# Patient Record
Sex: Male | Born: 1945 | Race: White | Hispanic: No | Marital: Married | Smoking: Former smoker
Health system: Southern US, Community
[De-identification: ages and names within clinical notes are randomized; demographics above are authoritative.]

## PROBLEM LIST (undated history)

## (undated) DIAGNOSIS — I2510 Atherosclerotic heart disease of native coronary artery without angina pectoris: Secondary | ICD-10-CM

## (undated) DIAGNOSIS — E119 Type 2 diabetes mellitus without complications: Secondary | ICD-10-CM

## (undated) DIAGNOSIS — I10 Essential (primary) hypertension: Secondary | ICD-10-CM

## (undated) DIAGNOSIS — E785 Hyperlipidemia, unspecified: Secondary | ICD-10-CM

## (undated) HISTORY — DX: Essential (primary) hypertension: I10

## (undated) HISTORY — DX: Atherosclerotic heart disease of native coronary artery without angina pectoris: I25.10

## (undated) HISTORY — DX: Hyperlipidemia, unspecified: E78.5

## (undated) HISTORY — DX: Type 2 diabetes mellitus without complications: E11.9

## (undated) HISTORY — PX: CORONARY ARTERY BYPASS GRAFT: SHX141

---

## 2000-09-02 ENCOUNTER — Inpatient Hospital Stay (HOSPITAL_COMMUNITY): Admission: EM | Admit: 2000-09-02 | Discharge: 2000-09-13 | Payer: Self-pay | Admitting: Internal Medicine

## 2000-09-03 ENCOUNTER — Encounter: Payer: Self-pay | Admitting: Internal Medicine

## 2000-09-07 ENCOUNTER — Encounter: Payer: Self-pay | Admitting: Cardiothoracic Surgery

## 2000-09-08 ENCOUNTER — Encounter: Payer: Self-pay | Admitting: Cardiothoracic Surgery

## 2000-09-09 ENCOUNTER — Encounter: Payer: Self-pay | Admitting: Cardiothoracic Surgery

## 2000-09-10 ENCOUNTER — Encounter: Payer: Self-pay | Admitting: Cardiothoracic Surgery

## 2013-10-05 ENCOUNTER — Telehealth: Payer: Self-pay | Admitting: Cardiovascular Disease

## 2013-10-05 ENCOUNTER — Encounter: Payer: Self-pay | Admitting: *Deleted

## 2013-10-05 NOTE — Telephone Encounter (Signed)
Left message asking for patient to call our office.  Need to give him appt information

## 2013-10-09 ENCOUNTER — Ambulatory Visit (INDEPENDENT_AMBULATORY_CARE_PROVIDER_SITE_OTHER): Payer: BC Managed Care – PPO | Admitting: Cardiovascular Disease

## 2013-10-09 ENCOUNTER — Encounter: Payer: Self-pay | Admitting: Cardiovascular Disease

## 2013-10-09 ENCOUNTER — Encounter: Payer: Self-pay | Admitting: *Deleted

## 2013-10-09 VITALS — BP 157/91 | HR 51 | Ht 75.0 in | Wt 261.0 lb

## 2013-10-09 DIAGNOSIS — R200 Anesthesia of skin: Secondary | ICD-10-CM

## 2013-10-09 DIAGNOSIS — I25810 Atherosclerosis of coronary artery bypass graft(s) without angina pectoris: Secondary | ICD-10-CM | POA: Insufficient documentation

## 2013-10-09 DIAGNOSIS — R209 Unspecified disturbances of skin sensation: Secondary | ICD-10-CM

## 2013-10-09 DIAGNOSIS — I10 Essential (primary) hypertension: Secondary | ICD-10-CM | POA: Insufficient documentation

## 2013-10-09 DIAGNOSIS — E785 Hyperlipidemia, unspecified: Secondary | ICD-10-CM | POA: Insufficient documentation

## 2013-10-09 DIAGNOSIS — R079 Chest pain, unspecified: Secondary | ICD-10-CM

## 2013-10-09 DIAGNOSIS — E119 Type 2 diabetes mellitus without complications: Secondary | ICD-10-CM | POA: Insufficient documentation

## 2013-10-09 MED ORDER — NITROGLYCERIN 0.4 MG SL SUBL: 0.4000 mg | SUBLINGUAL_TABLET | SUBLINGUAL | Status: AC | PRN

## 2013-10-09 NOTE — Patient Instructions (Signed)
Your physician has requested that you have an echocardiogram. Echocardiography is a painless test that uses sound waves to create images of your heart. It provides your doctor with information about the size and shape of your heart and how well your heart's chambers and valves are working. This procedure takes approximately one hour. There are no restrictions for this procedure. Your physician has requested that you have a lexiscan myoview. For further information please visit [email or web address]. Please follow instruction sheet, as given. Office will contact with results via phone or letter.   Nitroglycerin as needed for severe chest pain only - new sent to pharm Continue all other medications.   Follow up in 4-6 weeks

## 2013-10-09 NOTE — Progress Notes (Signed)
Patient ID: Colin Aguirre, male   DOB: [birth year], 68 y.o.   MRN: [id number]       CARDIOLOGY CONSULT NOTE  Patient ID: Colin Aguirre MRN: [id number] DOB/AGE: Aug 12, 1945 68 y.o.  Admit date: (Not on file) Primary Physician [name], MD  Reason for Consultation: chest pain, CAD/CABG  HPI: The patient is a 68 year old male who has been referred by Dr. Quintin Alto for the evaluation and treatment of chest pain. 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 As per an office visit note with his PCP on April 16, his HbA1c was most recently found to be 10%. An ECG performed on April 16 showed sinus rhythm, one PVC, and old inferior infarct.  He tells me he had bypass surgery in 2002. He has not had to use any nitroglycerin since that time. For the past 3 or 4 months, he has been experiencing intermittent bouts of chest pain usually lasting seconds, and located in the left precordium. Approximately one week ago, he had chest discomfort which lasted 30 seconds. At that time he had bilateral arm numbness. At the time of his heart attack in 2002, he did not experience chest pain. He said that he suddenly felt that his arms felt like "dead weight"and then felt anxious.   No Known Allergies  Current Outpatient Prescriptions  Medication Sig Dispense Refill  . aspirin EC 81 MG tablet Take 81 mg by mouth daily.      Marland Kitchen ezetimibe (ZETIA) 10 MG tablet Take 10 mg by mouth daily.      . fluconazole (DIFLUCAN) 100 MG tablet Take 100 mg by mouth daily. For 7 days then stop      . hydrochlorothiazide (HYDRODIURIL) 25 MG tablet Take 25 mg by mouth daily.      . Liraglutide (VICTOZA) 18 MG/3ML SOPN Inject 1.2 mg into the skin daily.       . metFORMIN (GLUCOPHAGE-XR) 500 MG 24 hr tablet Take 500 mg by mouth 2 (two) times daily. Take 3 tablets in AM and 2 tablets in PM      . metoprolol (LOPRESSOR) 50 MG  tablet Take 50 mg by mouth 2 (two) times daily.      . Omega-3 Fatty Acids (FISH OIL) 1000 MG CAPS Take 2 capsules by mouth daily.      . ramipril (ALTACE) 10 MG capsule Take 10 mg by mouth daily.      . Dapagliflozin Propanediol (FARXIGA) 5 MG TABS Take 1 tablet by mouth daily.      Marland Kitchen ketoconazole (NIZORAL) 2 % cream Apply 1 application topically as needed.       No current facility-administered medications for this visit.    Past Medical History  Diagnosis Date  . Diabetes mellitus   . Hyperlipidemia   . Hypertension   . CAD (coronary artery disease)     H/O MI AND CABG    Past Surgical History  Procedure Laterality Date  . Coronary artery bypass graft      History   Social History  . Marital Status: Married    Spouse Name: N/A    Number of Children: N/A  . Years of Education: N/A   Occupational History  . Not on file.   Social History Main Topics  . Smoking status: Former Smoker -- 1.00 packs/day for 44 years    Types: Cigarettes    Quit date: 06/23/2003  . Smokeless tobacco: Not on  file  . Alcohol Use: Not on file  . Drug Use: Not on file  . Sexual Activity: Not on file   Other Topics Concern  . Not on file   Social History Narrative  . No narrative on file     No family history of premature CAD in 1st degree relatives.  Prior to Admission medications   Medication Sig Start Date End Date Taking? Authorizing Provider  aspirin EC 81 MG tablet Take 81 mg by mouth daily.   Yes Historical Provider, MD  ezetimibe (ZETIA) 10 MG tablet Take 10 mg by mouth daily.   Yes Historical Provider, MD  fluconazole (DIFLUCAN) 100 MG tablet Take 100 mg by mouth daily. For 7 days then stop 10/05/13  Yes Historical Provider, MD  hydrochlorothiazide (HYDRODIURIL) 25 MG tablet Take 25 mg by mouth daily.   Yes Historical Provider, MD  Liraglutide (VICTOZA) 18 MG/3ML SOPN Inject 1.2 mg into the skin daily.    Yes Historical Provider, MD  metFORMIN (GLUCOPHAGE-XR) 500 MG 24 hr  tablet Take 500 mg by mouth 2 (two) times daily. Take 3 tablets in AM and 2 tablets in PM   Yes Historical Provider, MD  metoprolol (LOPRESSOR) 50 MG tablet Take 50 mg by mouth 2 (two) times daily.   Yes Historical Provider, MD  Omega-3 Fatty Acids (FISH OIL) 1000 MG CAPS Take 2 capsules by mouth daily.   Yes Historical Provider, MD  ramipril (ALTACE) 10 MG capsule Take 10 mg by mouth daily.   Yes Historical Provider, MD  Dapagliflozin Propanediol (FARXIGA) 5 MG TABS Take 1 tablet by mouth daily.    Historical Provider, MD  ketoconazole (NIZORAL) 2 % cream Apply 1 application topically as needed. 10/05/13   Historical Provider, MD     Review of systems complete and found to be negative unless listed above in HPI  BP 157/91  Pulse 51     Physical exam Height 6\' 3"  (1.905 m), weight 261 lb (118.389 kg). General: NAD Neck: No JVD, no thyromegaly or thyroid nodule.  Lungs: Clear to auscultation bilaterally with normal respiratory effort. CV: Nondisplaced PMI.  Heart regular S1/S2, no S3/S4, no murmur.  No peripheral edema.  No carotid bruit.  Normal pedal pulses.  Abdomen: Soft, nontender, no hepatosplenomegaly, no distention.  Skin: Intact without lesions or rashes.  Neurologic: Alert and oriented x 3.  Psych: Normal affect. Extremities: No clubbing or cyanosis.  HEENT: Normal.   Labs:   No results found for this basename: WBC, HGB, HCT, MCV, PLT   No results found for this basename: NA, K, CL, CO2, BUN, CREATININE, CALCIUM, LABALBU, PROT, BILITOT, ALKPHOS, ALT, AST, GLUCOSE,  in the last 168 hours No results found for this basename: CKTOTAL, CKMB, CKMBINDEX, TROPONINI    No results found for this basename: CHOL   No results found for this basename: HDL   No results found for this basename: LDLCALC   No results found for this basename: TRIG   No results found for this basename: CHOLHDL   No results found for this basename: LDLDIRECT         Studies: No results  found.  ASSESSMENT AND PLAN:  1. Chest pain with bilateral arm numbness in the setting of CAD/CABG: Given his most recent symptoms of bilateral arm numbness which appear to be his anginal equivalent, and the fact that he has not had an ischemic evaluation since prior to his bypass surgery, I will proceed with a Lexiscan Cardiolite stress test to  evaluate for ischemia. I will also obtain an echocardiogram to get a baseline assessment of his left ventricular function and to assess regional wall motion. I will give him a prescription for sublingual nitroglycerin to be used as needed. Continue ASA, Zetia, and metoprolol. 2. HTN: It is elevated today, and he tells me that his systolic reading at his PCPs office last week was at least 140 mmHg. I will monitor this and if it remains elevated at his next visit, I would consider the addition of amlodipine. He is already taking good doses of both hydrochlorothiazide and ramipril. 3. Hyperlipidemia: He tells me that his cholesterol was checked at his PCPs office I will try and obtain these results.  Dispo: f/u 4-6 weeks.  Signed: Kate Sable, M.D., F.A.C.C.  10/09/2013, 8:40 AM

## 2013-10-12 ENCOUNTER — Encounter (HOSPITAL_COMMUNITY)
Admission: RE | Admit: 2013-10-12 | Discharge: 2013-10-12 | Disposition: A | Discharge status: Home / Self Care | Payer: BC Managed Care – PPO | Source: Ambulatory Visit | Attending: Cardiovascular Disease | Admitting: Cardiovascular Disease

## 2013-10-12 ENCOUNTER — Encounter (HOSPITAL_COMMUNITY): Payer: Self-pay

## 2013-10-12 ENCOUNTER — Ambulatory Visit (HOSPITAL_COMMUNITY)
Admission: RE | Admit: 2013-10-12 | Discharge: 2013-10-12 | Disposition: A | Discharge status: Home / Self Care | Payer: BC Managed Care – PPO | Source: Ambulatory Visit | Attending: Cardiovascular Disease | Admitting: Cardiovascular Disease

## 2013-10-12 DIAGNOSIS — I25810 Atherosclerosis of coronary artery bypass graft(s) without angina pectoris: Secondary | ICD-10-CM

## 2013-10-12 DIAGNOSIS — R079 Chest pain, unspecified: Secondary | ICD-10-CM | POA: Insufficient documentation

## 2013-10-12 DIAGNOSIS — I2119 ST elevation (STEMI) myocardial infarction involving other coronary artery of inferior wall: Secondary | ICD-10-CM | POA: Insufficient documentation

## 2013-10-12 DIAGNOSIS — I2510 Atherosclerotic heart disease of native coronary artery without angina pectoris: Secondary | ICD-10-CM | POA: Insufficient documentation

## 2013-10-12 DIAGNOSIS — Z951 Presence of aortocoronary bypass graft: Secondary | ICD-10-CM | POA: Insufficient documentation

## 2013-10-12 MED ORDER — REGADENOSON 0.4 MG/5ML IV SOLN
INTRAVENOUS | Status: AC
  Administered 2013-10-12: 0.4 mg via INTRAVENOUS
  Filled 2013-10-12: qty 5

## 2013-10-12 MED ORDER — TECHNETIUM TC 99M SESTAMIBI - CARDIOLITE
30.0000 | Freq: Once | INTRAVENOUS | Status: AC | PRN
  Administered 2013-10-12: 30 via INTRAVENOUS

## 2013-10-12 MED ORDER — TECHNETIUM TC 99M SESTAMIBI GENERIC - CARDIOLITE
10.0000 | Freq: Once | INTRAVENOUS | Status: AC | PRN
  Administered 2013-10-12: 10 via INTRAVENOUS

## 2013-10-12 MED ORDER — SODIUM CHLORIDE 0.9 % IJ SOLN
INTRAMUSCULAR | Status: AC
  Administered 2013-10-12: 10 mL via INTRAVENOUS
  Filled 2013-10-12: qty 10

## 2013-10-12 NOTE — Progress Notes (Signed)
Patient arrived for his nuclear med study but did not stay for the echocardiogram. Staff said that he was advised that he had another test after nuclear study.   Jamison Neighbor, RDCS

## 2013-10-12 NOTE — Progress Notes (Signed)
Stress Lab Nurses Notes - Colin Aguirre  Colin Aguirre 10/12/2013 Reason for doing test: CAD and Chest Pain Type of test: Wille Glaser Nurse performing test: Colin Halls, RN Nuclear Medicine Tech: Colin Aguirre Echo Tech: Not Applicable MD performing test: Branch/K.Lawrence NP Family MD: Colin Aguirre Test explained and consent signed: yes IV started: 22g jelco, Saline lock flushed, No redness or edema and Saline lock started in radiology Symptoms: Headach Treatment/Intervention: None Reason test stopped: protocol completed After recovery IV was: Discontinued via X-ray tech and No redness or edema Patient to return to [place]. Med at : 10:45 Patient discharged: Home Patient's Condition upon discharge was: stable Comments: During test BP 126/80 & HR 99.  Recovery BP 132/85 & HR 83.  Symptoms resolved in recovery. Colin Aguirre

## 2013-10-17 ENCOUNTER — Telehealth: Payer: Self-pay | Admitting: *Deleted

## 2013-10-17 NOTE — Telephone Encounter (Signed)
Message copied by Laurine Blazer on Tue Oct 17, 2013  4:04 PM ------      Message from: Kate Sable A      Created: Fri Oct 13, 2013  1:29 PM       Please have pt f/u with me next week so that I can discuss results and plan next steps of management. ------

## 2013-10-17 NOTE — Telephone Encounter (Signed)
Notes Recorded by Laurine Blazer, LPN on 6/96/2952 at 8:41 PM Patient notified and verbalized understanding. OV scheduled for 10/18/2013 at 1:00 with Dr. Bronson Ing in the [place] office. SK not back in [place] till 10/25/13. ------  Notes Recorded by Laurine Blazer, LPN on 09/12/4008 at 27:25 PM Left message to return call.

## 2013-10-18 ENCOUNTER — Ambulatory Visit (INDEPENDENT_AMBULATORY_CARE_PROVIDER_SITE_OTHER): Payer: BC Managed Care – PPO | Admitting: Cardiovascular Disease

## 2013-10-18 ENCOUNTER — Other Ambulatory Visit: Payer: Self-pay | Admitting: Cardiovascular Disease

## 2013-10-18 ENCOUNTER — Encounter: Payer: Self-pay | Admitting: Cardiovascular Disease

## 2013-10-18 VITALS — BP 142/78 | HR 76 | Ht 72.0 in | Wt 256.0 lb

## 2013-10-18 DIAGNOSIS — E119 Type 2 diabetes mellitus without complications: Secondary | ICD-10-CM

## 2013-10-18 DIAGNOSIS — R079 Chest pain, unspecified: Secondary | ICD-10-CM

## 2013-10-18 DIAGNOSIS — R931 Abnormal findings on diagnostic imaging of heart and coronary circulation: Secondary | ICD-10-CM

## 2013-10-18 DIAGNOSIS — R0602 Shortness of breath: Secondary | ICD-10-CM

## 2013-10-18 DIAGNOSIS — R9439 Abnormal result of other cardiovascular function study: Secondary | ICD-10-CM

## 2013-10-18 DIAGNOSIS — I10 Essential (primary) hypertension: Secondary | ICD-10-CM

## 2013-10-18 DIAGNOSIS — E785 Hyperlipidemia, unspecified: Secondary | ICD-10-CM

## 2013-10-18 DIAGNOSIS — I25810 Atherosclerosis of coronary artery bypass graft(s) without angina pectoris: Secondary | ICD-10-CM

## 2013-10-18 MED ORDER — AMLODIPINE BESYLATE 5 MG PO TABS: 5.0000 mg | ORAL_TABLET | Freq: Every day | ORAL | Status: DC

## 2013-10-18 NOTE — Patient Instructions (Addendum)
Your physician recommends that you schedule a follow-up appointment in 2 months  Heart cath is tomorrow see instruction sheet provided you  Your physician has recommended you make the following change in your medication:     START Norvasc 5 mg daily      Thank you for choosing [facility] !

## 2013-10-18 NOTE — Progress Notes (Signed)
Patient ID: Colin Aguirre, male   DOB: 1945/08/07, 68 y.o.   MRN: [id number]      SUBJECTIVE: The patient is here to f/u on the results of cardiovascular testing performed for the evaluation of chest pain. 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 As per an office visit note with his PCP on April 16, his HbA1c was most recently found to be 10%.  An ECG performed on April 16 showed sinus rhythm, one PVC, and old inferior infarct.  He had told me at his last visit with me that he had bypass surgery in 2002. He has not had to use any nitroglycerin since that time. For the past 3 or 4 months, he has been experiencing intermittent bouts of chest pain usually lasting seconds, and located in the left precordium. Approximately two weeks ago, he had chest discomfort which lasted 30 seconds. At that time he had bilateral arm numbness. At the time of his heart attack in 2002, he did not experience chest pain. He said that he suddenly felt that his arms felt like "dead weight"and then felt anxious.  The day after his stress test, he experienced dyspnea with exertion and mild chest discomfort, but did not require an SL nitro.     No Known Allergies  Current Outpatient Prescriptions  Medication Sig Dispense Refill  . aspirin EC 81 MG tablet Take 81 mg by mouth daily.      . Dapagliflozin Propanediol (FARXIGA) 5 MG TABS Take 1 tablet by mouth daily.      Marland Kitchen ezetimibe (ZETIA) 10 MG tablet Take 10 mg by mouth daily.      . fluconazole (DIFLUCAN) 100 MG tablet Take 100 mg by mouth daily. For 7 days then stop      . hydrochlorothiazide (HYDRODIURIL) 25 MG tablet Take 25 mg by mouth daily.      Marland Kitchen ketoconazole (NIZORAL) 2 % cream Apply 1 application topically as needed.      . Liraglutide (VICTOZA) 18 MG/3ML SOPN Inject 1.2 mg into the skin daily.       . metFORMIN (GLUCOPHAGE-XR) 500 MG 24 hr  tablet Take 500 mg by mouth 2 (two) times daily. Take 3 tablets in AM and 2 tablets in PM      . metoprolol (LOPRESSOR) 50 MG tablet Take 50 mg by mouth 2 (two) times daily.      . nitroGLYCERIN (NITROSTAT) 0.4 MG SL tablet Place 1 tablet (0.4 mg total) under the tongue every 5 (five) minutes as needed for chest pain.  25 tablet  3  . Omega-3 Fatty Acids (FISH OIL) 1000 MG CAPS Take 2 capsules by mouth daily.      . ramipril (ALTACE) 10 MG capsule Take 10 mg by mouth daily.       No current facility-administered medications for this visit.    Past Medical History  Diagnosis Date  . Diabetes mellitus   . Hyperlipidemia   . Hypertension   . CAD (coronary artery disease)     H/O MI AND CABG    Past Surgical History  Procedure Laterality Date  . Coronary artery bypass graft      History   Social History  . Marital Status: Married    Spouse Name: N/A    Number of Children: N/A  . Years of Education: N/A   Occupational History  . Not on file.   Social History Main  Topics  . Smoking status: Former Smoker -- 1.00 packs/day for 44 years    Types: Cigarettes    Quit date: 06/23/2003  . Smokeless tobacco: Not on file  . Alcohol Use: Not on file  . Drug Use: Not on file  . Sexual Activity: Not on file   Other Topics Concern  . Not on file   Social History Narrative  . No narrative on file     Filed Vitals:   10/18/13 1254  BP: 142/78  Pulse: 76  Height: 6' (1.829 m)  Weight: 256 lb (116.121 kg)    PHYSICAL EXAM General: NAD Neck: No JVD, no thyromegaly. Lungs: Clear to auscultation bilaterally with normal respiratory effort. CV: Nondisplaced PMI.  Regular rate and rhythm, normal S1/S2, no S3/S4, no murmur. No pretibial or periankle edema.  No carotid bruit.  Normal pedal pulses.  Abdomen: Soft, nontender, no hepatosplenomegaly, no distention.  Neurologic: Alert and oriented x 3.  Psych: Normal affect. Extremities: No clubbing or cyanosis.   ECG: reviewed and  available in electronic records.   Myocardial perfusion imaging  Raw images showed appropriate radiotracer uptake. There was a moderate-sized moderately reversible inferior wall defect. Gated imaging showed end-diastolic volume 629 mL, and systolic volume 75 mL, left ventricular ejection fraction 40%, t.i.d. 0.90. The inferior wall was hypokinetic.  IMPRESSION: 1. Abnormal Lexiscan MPI for ischemia  2. Moderate sized inferior wall infarct with moderate peri-infarct ischemia  3. Decreased left ventricular systolic function, LVEF 52%  4. High risk study for major cardiac events based on low ejection fraction and moderate area of myocardium at jeopardy.      ASSESSMENT AND PLAN: 1. Chest pain with bilateral arm numbness in the setting of CAD/CABG: Lexiscan Cardiolite stress test showed moderate inferior wall peri-infarct ischemia and deemed a high risk study. Will proceed with coronary angiography. Continue ASA, Zetia, SL nitro, and metoprolol. I do not see the results of echocardiography which I had ordered at his last visit. I will follow up on this. 2. HTN: It is mildly elevated today, and systolic readings have continue to be in the low 140 mmHg. I will add amlodipine 5 mg daily, which may also help with anginal relief. He is already taking adequate doses of both hydrochlorothiazide and ramipril.  3. Hyperlipidemia: He tells me that his cholesterol was checked at his PCPs office I will try and obtain these results.   Dispo: f/u in 2 months.   Kate Sable, M.D., F.A.C.C.

## 2013-10-18 NOTE — Addendum Note (Signed)
Addended by: Barbarann Ehlers A on: 10/18/2013 01:58 PM   Modules accepted: Orders

## 2013-10-19 ENCOUNTER — Encounter (HOSPITAL_COMMUNITY)
Admission: RE | Disposition: A | Discharge status: Home / Self Care | Payer: BC Managed Care – PPO | Source: Ambulatory Visit | Attending: Cardiovascular Disease

## 2013-10-19 ENCOUNTER — Ambulatory Visit (HOSPITAL_COMMUNITY)
Admission: RE | Admit: 2013-10-19 | Discharge: 2013-10-20 | Disposition: A | Discharge status: Home / Self Care | Payer: BC Managed Care – PPO | Source: Ambulatory Visit | Attending: Cardiovascular Disease | Admitting: Cardiovascular Disease

## 2013-10-19 ENCOUNTER — Ambulatory Visit (HOSPITAL_COMMUNITY): Payer: BC Managed Care – PPO

## 2013-10-19 DIAGNOSIS — R079 Chest pain, unspecified: Secondary | ICD-10-CM

## 2013-10-19 DIAGNOSIS — I10 Essential (primary) hypertension: Secondary | ICD-10-CM | POA: Insufficient documentation

## 2013-10-19 DIAGNOSIS — I2510 Atherosclerotic heart disease of native coronary artery without angina pectoris: Secondary | ICD-10-CM | POA: Insufficient documentation

## 2013-10-19 DIAGNOSIS — I25810 Atherosclerosis of coronary artery bypass graft(s) without angina pectoris: Secondary | ICD-10-CM

## 2013-10-19 DIAGNOSIS — I252 Old myocardial infarction: Secondary | ICD-10-CM | POA: Insufficient documentation

## 2013-10-19 DIAGNOSIS — I200 Unstable angina: Secondary | ICD-10-CM | POA: Insufficient documentation

## 2013-10-19 DIAGNOSIS — R0602 Shortness of breath: Secondary | ICD-10-CM

## 2013-10-19 DIAGNOSIS — R931 Abnormal findings on diagnostic imaging of heart and coronary circulation: Secondary | ICD-10-CM

## 2013-10-19 DIAGNOSIS — Z7982 Long term (current) use of aspirin: Secondary | ICD-10-CM | POA: Insufficient documentation

## 2013-10-19 DIAGNOSIS — E785 Hyperlipidemia, unspecified: Secondary | ICD-10-CM | POA: Insufficient documentation

## 2013-10-19 DIAGNOSIS — Z87891 Personal history of nicotine dependence: Secondary | ICD-10-CM | POA: Insufficient documentation

## 2013-10-19 DIAGNOSIS — E119 Type 2 diabetes mellitus without complications: Secondary | ICD-10-CM | POA: Insufficient documentation

## 2013-10-19 HISTORY — PX: LEFT HEART CATHETERIZATION WITH CORONARY/GRAFT ANGIOGRAM: SHX5450

## 2013-10-19 HISTORY — PX: PERCUTANEOUS CORONARY STENT INTERVENTION (PCI-S): SHX5485

## 2013-10-19 LAB — GLUCOSE, CAPILLARY
GLUCOSE-CAPILLARY: 300 mg/dL — AB (ref 70–99)
GLUCOSE-CAPILLARY: 215 mg/dL — AB (ref 70–99)
Glucose-Capillary: 164 mg/dL — ABNORMAL HIGH (ref 70–99)

## 2013-10-19 LAB — BASIC METABOLIC PANEL
BUN: 22 mg/dL (ref 6–23)
CHLORIDE: 97 meq/L (ref 96–112)
CO2: 23 meq/L (ref 19–32)
CREATININE: 0.99 mg/dL (ref 0.50–1.35)
Calcium: 9.4 mg/dL (ref 8.4–10.5)
GFR calc Af Amer: >90 mL/min (ref >90)
GFR calc non Af Amer: 83 mL/min — ABNORMAL LOW (ref >90)
Glucose, Bld: 287 mg/dL — ABNORMAL HIGH (ref 70–99)
POTASSIUM: 4 meq/L (ref 3.7–5.3)
SODIUM: 138 meq/L (ref 137–147)

## 2013-10-19 LAB — CBC
HEMATOCRIT: 44.4 % (ref 39.0–52.0)
Hemoglobin: 14.9 g/dL (ref 13.0–17.0)
MCH: 29.5 pg (ref 26.0–34.0)
MCHC: 33.6 g/dL (ref 30.0–36.0)
MCV: 87.9 fL (ref 78.0–100.0)
Platelets: 199 10*3/uL (ref 150–400)
RBC: 5.05 MIL/uL (ref 4.22–5.81)
RDW: 13.5 % (ref 11.5–15.5)
WBC: 7.9 10*3/uL (ref 4.0–10.5)

## 2013-10-19 LAB — PROTIME-INR
INR: 0.95 (ref 0.00–1.49)
Prothrombin Time: 12.5 seconds (ref 11.6–15.2)

## 2013-10-19 LAB — POCT ACTIVATED CLOTTING TIME: Activated Clotting Time: 559 seconds

## 2013-10-19 SURGERY — LEFT HEART CATHETERIZATION WITH CORONARY/GRAFT ANGIOGRAM
Anesthesia: LOCAL

## 2013-10-19 MED ORDER — LIDOCAINE HCL (PF) 1 % IJ SOLN
INTRAMUSCULAR | Status: AC
  Filled 2013-10-19: qty 30

## 2013-10-19 MED ORDER — ASPIRIN 81 MG PO CHEW
81.0000 mg | CHEWABLE_TABLET | ORAL | Status: AC
  Administered 2013-10-19: 81 mg via ORAL
  Filled 2013-10-19: qty 1

## 2013-10-19 MED ORDER — BIVALIRUDIN 250 MG IV SOLR
INTRAVENOUS | Status: AC
  Filled 2013-10-19: qty 250

## 2013-10-19 MED ORDER — MORPHINE SULFATE 2 MG/ML IJ SOLN: 2.0000 mg | INTRAMUSCULAR | Status: DC | PRN

## 2013-10-19 MED ORDER — CLOPIDOGREL BISULFATE 300 MG PO TABS
ORAL_TABLET | ORAL | Status: AC
  Filled 2013-10-19: qty 1

## 2013-10-19 MED ORDER — AMLODIPINE BESYLATE 5 MG PO TABS
5.0000 mg | ORAL_TABLET | Freq: Every day | ORAL | Status: DC
  Administered 2013-10-20: 10:00:00 5 mg via ORAL
  Filled 2013-10-19: qty 1

## 2013-10-19 MED ORDER — LIRAGLUTIDE 18 MG/3ML ~~LOC~~ SOPN: 1.2000 mg | PEN_INJECTOR | Freq: Every day | SUBCUTANEOUS | Status: DC

## 2013-10-19 MED ORDER — HYDROCHLOROTHIAZIDE 25 MG PO TABS
25.0000 mg | ORAL_TABLET | Freq: Every day | ORAL | Status: DC
  Administered 2013-10-20: 25 mg via ORAL
  Filled 2013-10-19 (×2): qty 1

## 2013-10-19 MED ORDER — FENTANYL CITRATE 0.05 MG/ML IJ SOLN
INTRAMUSCULAR | Status: AC
  Filled 2013-10-19: qty 2

## 2013-10-19 MED ORDER — SODIUM CHLORIDE 0.9 % IJ SOLN: 3.0000 mL | INTRAMUSCULAR | Status: DC | PRN

## 2013-10-19 MED ORDER — CLOPIDOGREL BISULFATE 75 MG PO TABS
75.0000 mg | ORAL_TABLET | Freq: Every day | ORAL | Status: DC
Start: 2013-10-20 — End: 2013-10-20
  Administered 2013-10-20: 75 mg via ORAL
  Filled 2013-10-19: qty 1

## 2013-10-19 MED ORDER — ASPIRIN EC 81 MG PO TBEC
81.0000 mg | DELAYED_RELEASE_TABLET | Freq: Every day | ORAL | Status: DC
  Administered 2013-10-20: 81 mg via ORAL
  Filled 2013-10-19: qty 1

## 2013-10-19 MED ORDER — OXYCODONE-ACETAMINOPHEN 5-325 MG PO TABS
1.0000 | ORAL_TABLET | ORAL | Status: DC | PRN
Start: 2013-10-19 — End: 2013-10-20

## 2013-10-19 MED ORDER — NITROGLYCERIN 0.4 MG SL SUBL: 0.4000 mg | SUBLINGUAL_TABLET | SUBLINGUAL | Status: DC | PRN

## 2013-10-19 MED ORDER — NITROGLYCERIN 0.2 MG/ML ON CALL CATH LAB
INTRAVENOUS | Status: AC
  Filled 2013-10-19: qty 1

## 2013-10-19 MED ORDER — VERAPAMIL HCL 2.5 MG/ML IV SOLN
INTRAVENOUS | Status: AC
  Filled 2013-10-19: qty 2

## 2013-10-19 MED ORDER — EZETIMIBE 10 MG PO TABS
10.0000 mg | ORAL_TABLET | Freq: Every day | ORAL | Status: DC
  Administered 2013-10-20: 10:00:00 10 mg via ORAL
  Filled 2013-10-19: qty 1

## 2013-10-19 MED ORDER — METOPROLOL TARTRATE 50 MG PO TABS
50.0000 mg | ORAL_TABLET | Freq: Two times a day (BID) | ORAL | Status: DC
  Administered 2013-10-19 – 2013-10-20 (×2): 50 mg via ORAL
  Filled 2013-10-19 (×3): qty 1

## 2013-10-19 MED ORDER — RAMIPRIL 10 MG PO CAPS
10.0000 mg | ORAL_CAPSULE | Freq: Every day | ORAL | Status: DC
  Administered 2013-10-20: 10 mg via ORAL
  Filled 2013-10-19: qty 1

## 2013-10-19 MED ORDER — MIDAZOLAM HCL 2 MG/2ML IJ SOLN
INTRAMUSCULAR | Status: AC
  Filled 2013-10-19: qty 2

## 2013-10-19 MED ORDER — SODIUM CHLORIDE 0.9 % IJ SOLN: 3.0000 mL | Freq: Two times a day (BID) | INTRAMUSCULAR | Status: DC

## 2013-10-19 MED ORDER — HEPARIN (PORCINE) IN NACL 2-0.9 UNIT/ML-% IJ SOLN
INTRAMUSCULAR | Status: AC
  Filled 2013-10-19: qty 1000

## 2013-10-19 MED ORDER — SODIUM CHLORIDE 0.9 % IV SOLN: INTRAVENOUS | Status: AC

## 2013-10-19 MED ORDER — HEPARIN SODIUM (PORCINE) 1000 UNIT/ML IJ SOLN
INTRAMUSCULAR | Status: AC
  Filled 2013-10-19: qty 1

## 2013-10-19 MED ORDER — SODIUM CHLORIDE 0.9 % IV SOLN
250.0000 mL | INTRAVENOUS | Status: DC | PRN
Start: 2013-10-19 — End: 2013-10-19
  Administered 2013-10-19: 11:00:00 via INTRAVENOUS

## 2013-10-19 NOTE — CV Procedure (Signed)
Cardiac Catheterization Operative Report  Colin Aguirre [id number] 4/30/20154:56 PM [name], MD  Procedure Performed:  1. Left Heart Catheterization 2. Selective Coronary Angiography 3. SVG angiography 4. LIMA graft angiography 5. PTCA/DES x 1 mid body of SVG to OM 6. PTCA/DES x 1 proximal Circumflex 7. Angioseal RFA  Operator: Lauree Chandler, MD  Arterial access site:  Left radial artery and right femoral artery  Indication: 68 yo male with history of CAD s/p 5V CABG in 2002 with chest pain c/w class III unstable angina, high risk stress myoview. Pt also has uncontrolled DM, HTN.                                       Procedure Details: The risks, benefits, complications, treatment options, and expected outcomes were discussed with the patient. The patient and/or family concurred with the proposed plan, giving informed consent. The patient was brought to the cath lab after IV hydration was begun and oral premedication was given. The patient was further sedated with Versed and Fentanyl. The right wrist was assessed with an Allens test which was positive. The right wrist was prepped and draped in a sterile fashion. 1% lidocaine was used for local anesthesia. Using the modified Seldinger access technique, a 5 French sheath was placed in the right radial artery. 3 mg Verapamil was given through the sheath. 6000 units IV heparin was given. Standard diagnostic catheters were used to perform selective coronary angiography. I engaged the LIMA graft with the JR4 cather. I then engaged the native RCA and SVG to RCA with the JR4. I was able to engage the SVG to the OM with a LCB catheter. I could not engage the SVG to the Diagonal with multiple different catheters. I elected to gain access in the right femoral artery with a 6 French sheath. I then engaged the SVG to Diagonal with a LCB catheter. The left main was engaged with a JL3.5 catheter. A pigtail catheter was used to  cross the AV and measure pressures in the LV. At this time, I elected to perform PCI of the SVG to OM and the native Circumflex.   PCI Note:  Lesion #1 mid body of SVG to OM: The patient was given a bolus of Angiomax and a drip was started. He was given Plavix 600 mg po x 1. I engaged the SVG to the OM with a LCB guiding catheter. When the ACT was over 200, I passed a Cougar IC wire down the SVG. I then changed out for a 4.0 spider distal protection device. A 2.5 x 12 mm Xience DES was deployed in the mid body of the SVG. The stenosis was taken from 99% down to 0%. I then retrieved the distal protection device.   Lesion #2 proximal Circumflex: The left main was engaged with a XB 3.0 guiding catheter. A Cougar IC wire was advanced down the native Circumflex. A 2.0 x 12 mm balloon was used to pre-dilate the proximal stenosis. I then carefully positioned and deployed a 2.5 x 28 mm Xience DES in the proximal Circumflex. The stent was post-dilated with a 2.75 x 20 mm Gardners balloon x 2. The stenosis was taken from 90% down to 0%.   The sheath was removed from the right radial artery and a Terumo hemostasis band was applied at the arteriotomy site on the right wrist. An Angioseal femoral  artery closure device was placed in the right femoral artery.   There were no immediate complications. The patient was taken to the recovery area in stable condition.   Hemodynamic Findings: Central aortic pressure: 135/67 Left ventricular pressure: 144/10/17  Angiographic Findings:  Left main: Distal 20% stenosis.   Left Anterior Descending Artery: Large caliber vessel that courses to the apex. The mid vessel has diffuse 80% stenosis. The distal vessel fills from the patent IMA graft and from antegrade flow. The diagonal is occluded and fills from the patent vein graft.   Circumflex Artery: Large caliber vessel with 90% proximal stenosis, occluded OM1. The first OM fills from the patent vein graft (with flow compromised  by severe stenosis in vein graft).   Right Coronary Artery: Large dominant vessel with 100% proximal occlusion. The distal vessel fills from the patent sequential vein graft.   Graft Anatomy:  SVG to OM is patent with 99% stenosis in the mid body of the graft SVG to Diagonal is patent with ostial 40% stenosis SVG sequential to PDA/PLA is patent LIMA to mid LAD is patent  Left Ventricular Angiogram: Deferred.   Impression: 1. Severe triple vessel CAD s/p 5V CABG with 5/5 patent bypass grafts 2. Unstable angina secondary to severe stenosis SVG to OM and severe stenosis native Circumflex unprotected by grafting.  3. Successful PTCA/DES x 1 SVG to OM 4. Successful PTCA/DES x 1 proximal Circumflex  Recommendations: He will need dual antiplatelet therapy with ASA and Plavix for lifetime. D/c home in am if stable.        Complications:  None. The patient tolerated the procedure well.

## 2013-10-19 NOTE — H&P (View-Only) (Signed)
Patient ID: Colin Aguirre, male   DOB: 1945/08/07, 68 y.o.   MRN: [id number]      SUBJECTIVE: The patient is here to f/u on the results of cardiovascular testing performed for the evaluation of chest pain. 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 As per an office visit note with his PCP on April 16, his HbA1c was most recently found to be 10%.  An ECG performed on April 16 showed sinus rhythm, one PVC, and old inferior infarct.  He had told me at his last visit with me that he had bypass surgery in 2002. He has not had to use any nitroglycerin since that time. For the past 3 or 4 months, he has been experiencing intermittent bouts of chest pain usually lasting seconds, and located in the left precordium. Approximately two weeks ago, he had chest discomfort which lasted 30 seconds. At that time he had bilateral arm numbness. At the time of his heart attack in 2002, he did not experience chest pain. He said that he suddenly felt that his arms felt like "dead weight"and then felt anxious.  The day after his stress test, he experienced dyspnea with exertion and mild chest discomfort, but did not require an SL nitro.     No Known Allergies  Current Outpatient Prescriptions  Medication Sig Dispense Refill  . aspirin EC 81 MG tablet Take 81 mg by mouth daily.      . Dapagliflozin Propanediol (FARXIGA) 5 MG TABS Take 1 tablet by mouth daily.      Marland Kitchen ezetimibe (ZETIA) 10 MG tablet Take 10 mg by mouth daily.      . fluconazole (DIFLUCAN) 100 MG tablet Take 100 mg by mouth daily. For 7 days then stop      . hydrochlorothiazide (HYDRODIURIL) 25 MG tablet Take 25 mg by mouth daily.      Marland Kitchen ketoconazole (NIZORAL) 2 % cream Apply 1 application topically as needed.      . Liraglutide (VICTOZA) 18 MG/3ML SOPN Inject 1.2 mg into the skin daily.       . metFORMIN (GLUCOPHAGE-XR) 500 MG 24 hr  tablet Take 500 mg by mouth 2 (two) times daily. Take 3 tablets in AM and 2 tablets in PM      . metoprolol (LOPRESSOR) 50 MG tablet Take 50 mg by mouth 2 (two) times daily.      . nitroGLYCERIN (NITROSTAT) 0.4 MG SL tablet Place 1 tablet (0.4 mg total) under the tongue every 5 (five) minutes as needed for chest pain.  25 tablet  3  . Omega-3 Fatty Acids (FISH OIL) 1000 MG CAPS Take 2 capsules by mouth daily.      . ramipril (ALTACE) 10 MG capsule Take 10 mg by mouth daily.       No current facility-administered medications for this visit.    Past Medical History  Diagnosis Date  . Diabetes mellitus   . Hyperlipidemia   . Hypertension   . CAD (coronary artery disease)     H/O MI AND CABG    Past Surgical History  Procedure Laterality Date  . Coronary artery bypass graft      History   Social History  . Marital Status: Married    Spouse Name: N/A    Number of Children: N/A  . Years of Education: N/A   Occupational History  . Not on file.   Social History Main  Topics  . Smoking status: Former Smoker -- 1.00 packs/day for 44 years    Types: Cigarettes    Quit date: 06/23/2003  . Smokeless tobacco: Not on file  . Alcohol Use: Not on file  . Drug Use: Not on file  . Sexual Activity: Not on file   Other Topics Concern  . Not on file   Social History Narrative  . No narrative on file     Filed Vitals:   10/18/13 1254  BP: 142/78  Pulse: 76  Height: 6' (1.829 m)  Weight: 256 lb (116.121 kg)    PHYSICAL EXAM General: NAD Neck: No JVD, no thyromegaly. Lungs: Clear to auscultation bilaterally with normal respiratory effort. CV: Nondisplaced PMI.  Regular rate and rhythm, normal S1/S2, no S3/S4, no murmur. No pretibial or periankle edema.  No carotid bruit.  Normal pedal pulses.  Abdomen: Soft, nontender, no hepatosplenomegaly, no distention.  Neurologic: Alert and oriented x 3.  Psych: Normal affect. Extremities: No clubbing or cyanosis.   ECG: reviewed and  available in electronic records.   Myocardial perfusion imaging  Raw images showed appropriate radiotracer uptake. There was a moderate-sized moderately reversible inferior wall defect. Gated imaging showed end-diastolic volume 233 mL, and systolic volume 75 mL, left ventricular ejection fraction 40%, t.i.d. 0.90. The inferior wall was hypokinetic.  IMPRESSION: 1. Abnormal Lexiscan MPI for ischemia  2. Moderate sized inferior wall infarct with moderate peri-infarct ischemia  3. Decreased left ventricular systolic function, LVEF 00%  4. High risk study for major cardiac events based on low ejection fraction and moderate area of myocardium at jeopardy.      ASSESSMENT AND PLAN: 1. Chest pain with bilateral arm numbness in the setting of CAD/CABG: Lexiscan Cardiolite stress test showed moderate inferior wall peri-infarct ischemia and deemed a high risk study. Will proceed with coronary angiography. Continue ASA, Zetia, SL nitro, and metoprolol. I do not see the results of echocardiography which I had ordered at his last visit. I will follow up on this. 2. HTN: It is mildly elevated today, and systolic readings have continue to be in the low 140 mmHg. I will add amlodipine 5 mg daily, which may also help with anginal relief. He is already taking adequate doses of both hydrochlorothiazide and ramipril.  3. Hyperlipidemia: He tells me that his cholesterol was checked at his PCPs office I will try and obtain these results.   Dispo: f/u in 2 months.   Kate Sable, M.D., F.A.C.C.

## 2013-10-19 NOTE — Interval H&P Note (Signed)
History and Physical Interval Note:  10/19/2013 2:36 PM  Colin Aguirre  has presented today for cardiac cath with the diagnosis of Abnormal Nuclear Stress Test, chest pain.   The various methods of treatment have been discussed with the patient and family. After consideration of risks, benefits and other options for treatment, the patient has consented to  Procedure(s): LEFT HEART CATHETERIZATION WITH CORONARY/GRAFT ANGIOGRAM (N/A) as a surgical intervention .  The patient's history has been reviewed, patient examined, no change in status, stable for surgery.  I have reviewed the patient's chart and labs.  Questions were answered to the patient's satisfaction.    Cath Lab Visit (complete for each Cath Lab visit)  Clinical Evaluation Leading to the Procedure:   ACS: no  Non-ACS:    Anginal Classification: CCS III  Anti-ischemic medical therapy: Minimal Therapy (1 class of medications)  Non-Invasive Test Results: High-risk stress test findings: cardiac mortality >3%/year  Prior CABG: Previous CABG        [name]

## 2013-10-20 ENCOUNTER — Encounter (HOSPITAL_COMMUNITY): Payer: Self-pay | Admitting: *Deleted

## 2013-10-20 DIAGNOSIS — R0602 Shortness of breath: Secondary | ICD-10-CM

## 2013-10-20 DIAGNOSIS — I10 Essential (primary) hypertension: Secondary | ICD-10-CM

## 2013-10-20 DIAGNOSIS — R9439 Abnormal result of other cardiovascular function study: Secondary | ICD-10-CM

## 2013-10-20 DIAGNOSIS — I25810 Atherosclerosis of coronary artery bypass graft(s) without angina pectoris: Secondary | ICD-10-CM

## 2013-10-20 DIAGNOSIS — R079 Chest pain, unspecified: Secondary | ICD-10-CM

## 2013-10-20 LAB — BASIC METABOLIC PANEL
BUN: 16 mg/dL (ref 6–23)
CO2: 28 mEq/L (ref 19–32)
CREATININE: 1 mg/dL (ref 0.50–1.35)
Calcium: 8.8 mg/dL (ref 8.4–10.5)
Chloride: 105 mEq/L (ref 96–112)
GFR calc non Af Amer: 76 mL/min — ABNORMAL LOW (ref >90)
GFR, EST AFRICAN AMERICAN: 89 mL/min — AB (ref >90)
Glucose, Bld: 209 mg/dL — ABNORMAL HIGH (ref 70–99)
Potassium: 4.1 mEq/L (ref 3.7–5.3)
Sodium: 144 mEq/L (ref 137–147)

## 2013-10-20 LAB — CBC
HCT: 40 % (ref 39.0–52.0)
Hemoglobin: 13.4 g/dL (ref 13.0–17.0)
MCH: 29.7 pg (ref 26.0–34.0)
MCHC: 33.5 g/dL (ref 30.0–36.0)
MCV: 88.7 fL (ref 78.0–100.0)
PLATELETS: 182 10*3/uL (ref 150–400)
RBC: 4.51 MIL/uL (ref 4.22–5.81)
RDW: 13.9 % (ref 11.5–15.5)
WBC: 7.7 10*3/uL (ref 4.0–10.5)

## 2013-10-20 LAB — GLUCOSE, CAPILLARY: Glucose-Capillary: 238 mg/dL — ABNORMAL HIGH (ref 70–99)

## 2013-10-20 MED ORDER — CLOPIDOGREL BISULFATE 75 MG PO TABS: 75.0000 mg | ORAL_TABLET | Freq: Every day | ORAL | Status: DC

## 2013-10-20 MED ORDER — METFORMIN HCL ER 500 MG PO TB24: 500.0000 mg | ORAL_TABLET | Freq: Two times a day (BID) | ORAL | Status: DC

## 2013-10-20 MED FILL — Sodium Chloride IV Soln 0.9%: INTRAVENOUS | Qty: 50 | Status: AC

## 2013-10-20 NOTE — Discharge Instructions (Signed)

## 2013-10-20 NOTE — Care Management Note (Unsigned)
    Page 1 of 1   10/20/2013     8:41:00 AM CARE MANAGEMENT NOTE 10/20/2013  Patient:  Colin Aguirre, Colin Aguirre   Account Number:  [id number]  Date Initiated:  10/20/2013  Documentation initiated by:  HUTCHINSON,CRYSTAL  Subjective/Objective Assessment:   OPIB  with abnormal Nuclear Stress Test     Action/Plan:   CM to follow for dispositon needs   Anticipated DC Date:  10/20/2013   Anticipated DC Plan:  HOME/SELF CARE         Choice offered to / List presented to:             Status of service:  In process, will continue to follow Medicare Important Message given?   (If response is "NO", the following Medicare IM given date fields will be blank) Date Medicare IM given:   Date Additional Medicare IM given:    Discharge Disposition:    Per UR Regulation:    If discussed at Long Length of Stay Meetings, dates discussed:    Comments:  10/19/13 05:30 PM Med Review:  No CM needs identified at this time.

## 2013-10-20 NOTE — Discharge Summary (Signed)
Discharge Summary   Patient ID: Colin Aguirre MRN: [id number], DOB/AGE: 1946-02-16 68 y.o. Admit date: 10/19/2013 D/C date:     10/20/2013  Primary Cardiologist: Dr. Bronson Ing  Principal Problem:   Unstable angina Active Problems:   CAD (coronary artery disease) of artery bypass graft   Diabetes mellitus   HTN (hypertension)   Hyperlipidemia   Admission Dates: 10/19/13-10/20/13 Discharge Diagnosis: Unstable angina s/p PTCA/DES x 1 mid body of SVG to OM PTCA/DES x 1 proximal Cx and angioseal RFA   HPI: Colin Aguirre is a 68 y.o. male with a history of diabetes mellitus, CAD s/p CABG x5 in 2002, HTN, and HLD who presented for elective cardiac cath for class III unstable angina and a high risk stress myoview.  He was referred to Dr. Bronson Ing by Dr. Quintin Alto for evaluation of intermittent bouts of precordial chest pain and bilateral arm numbness, which was thought to be his anginal equivalent. He underwent Lexiscan Cardiolite stress test which revealed moderate inferior wall peri-infarct ischemia and deemed a high risk study. It was elected to proceed with cardiac catheterization and 2D ECHO for baseline LV function and evaluation for WMAs.   Hospital Course: He underwent cardiac catheterization at [facility] on 10/19/13 which revealed severe triple vessel CAD s/p 5V CABG with 5/5 patent bypass grafts. The culprit liesons were severe stenosis in SVG to OM and severe stenosis native Circumflex unprotected by grafting. He underwent successful PTCA/DES x 1 SVG to OM and PTCA/DES x 1 proximal Circumflex. He was observed overnight.  The patient has had an uncomplicated hospital course and is recovering well. The radial catheter site is stable. He has been seen by Dr. Irish Lack today and deemed ready for discharge home. All follow-up appointments have been scheduled. Discharge medications include DAPT with ASA/Plavix for a lifetime, amlodipine 5mg , Zetia 10mg , HCTZ 25mg  qd, lopressor 50mg  BID, SL  NTG, and ramipril 10mg . He can resume his metformin on Sunday morning (>48 hours after contrast dye exposure).  Outstanding Studies- He will need an ECHO which is scheduled on 10/23/13   Discharge Vitals: Blood pressure 159/89, pulse 77, temperature 98 F (36.7 C), temperature source Oral, resp. rate 18, height 6' (1.829 m), weight 256 lb (116.12 kg), SpO2 96.00%.  Labs: Lab Results  Component Value Date   WBC 7.7 10/20/2013   HGB 13.4 10/20/2013   HCT 40.0 10/20/2013   MCV 88.7 10/20/2013   PLT 182 10/20/2013     Recent Labs Lab 10/20/13 0555  NA 144  K 4.1  CL 105  CO2 28  BUN 16  CREATININE 1.00  CALCIUM 8.8  GLUCOSE 209*    Diagnostic Studies/Procedures   Dg Chest 1 View  10/19/2013   CLINICAL DATA:  Short of breath.  EXAM: CHEST - 1 VIEW  COMPARISON:  None.  FINDINGS: Status post CABG surgery. Cardiac silhouette is normal in size and configuration. Normal mediastinal and hilar contours. Clear lungs. No pleural effusion. No pneumothorax.  The bony thorax is intact.  IMPRESSION: No active disease.   Nm Myocar Single W/spect W/wall Motion And Ef  10/12/2013   CLINICAL DATA:  68 year old male with a known history of coronary artery disease and previous CABG referred for chest pain.  EXAM: MYOCARDIAL IMAGING WITH SPECT (REST AND PHARMACOLOGIC-STRESS)  GATED LEFT VENTRICULAR WALL MOTION STUDY  LEFT VENTRICULAR EJECTION FRACTION  TECHNIQUE: Standard myocardial SPECT imaging was performed after resting intravenous injection of 10 mCi Tc-74m sestamibi. Subsequently, intravenous infusion of Lexiscan was performed under  the supervision of the Cardiology staff. At peak effect of the drug, 30 mCi Tc-13m sestamibi was injected intravenously and standard myocardial SPECT imaging was performed. Quantitative gated imaging was also performed to evaluate left ventricular wall motion, and estimate left ventricular ejection fraction.  COMPARISON:  None.  FINDINGS: Myocardial perfusion imaging  Baseline EKG  showed normal sinus rhythm with inferior Q-waves. After injection heart rate increased from 70 beats per min up to 105 beats per min and blood pressure decreased from 143/87 down to 126/80. The test was stopped after injection was complete, the patient did not experience any chest pain.  Post-injection EKG showed no ischemic changes, occasional PVCs.  Myocardial perfusion imaging  Raw images showed appropriate radiotracer uptake. There was a moderate-sized moderately reversible inferior wall defect. Gated imaging showed end-diastolic volume 716 mL, and systolic volume 75 mL, left ventricular ejection fraction 40%, t.i.d. 0.90. The inferior wall was hypokinetic.  IMPRESSION: 1.  Abnormal Lexiscan MPI for ischemia  2. Moderate sized inferior wall infarct with moderate peri-infarct ischemia  3.  Decreased left ventricular systolic function, LVEF 96%  4. High risk study for major cardiac events based on low ejection fraction and moderate area of myocardium at jeopardy.    Cardiac Catheterization Operative Report  Colin Aguirre  [id number]  4/30/20154:56 PM  [name], MD  Procedure Performed:  1. Left Heart Catheterization 2. Selective Coronary Angiography 3. SVG angiography 4. LIMA graft angiography 5. PTCA/DES x 1 mid body of SVG to OM 6. PTCA/DES x 1 proximal Circumflex 7. Angioseal RFA Operator: Lauree Chandler, MD  Arterial access site: Left radial artery and right femoral artery  Indication: 68 yo male with history of CAD s/p 5V CABG in 2002 with chest pain c/w class III unstable angina, high risk stress myoview. Pt also has uncontrolled DM, HTN.  Procedure Details:  The risks, benefits, complications, treatment options, and expected outcomes were discussed with the patient. The patient and/or family concurred with the proposed plan, giving informed consent. The patient was brought to the cath lab after IV hydration was begun and oral premedication was given. The patient was  further sedated with Versed and Fentanyl. The right wrist was assessed with an Allens test which was positive. The right wrist was prepped and draped in a sterile fashion. 1% lidocaine was used for local anesthesia. Using the modified Seldinger access technique, a 5 French sheath was placed in the right radial artery. 3 mg Verapamil was given through the sheath. 6000 units IV heparin was given. Standard diagnostic catheters were used to perform selective coronary angiography. I engaged the LIMA graft with the JR4 cather. I then engaged the native RCA and SVG to RCA with the JR4. I was able to engage the SVG to the OM with a LCB catheter. I could not engage the SVG to the Diagonal with multiple different catheters. I elected to gain access in the right femoral artery with a 6 French sheath. I then engaged the SVG to Diagonal with a LCB catheter. The left main was engaged with a JL3.5 catheter. A pigtail catheter was used to cross the AV and measure pressures in the LV. At this time, I elected to perform PCI of the SVG to OM and the native Circumflex.  PCI Note:  Lesion #1 mid body of SVG to OM: The patient was given a bolus of Angiomax and a drip was started. He was given Plavix 600 mg po x 1. I engaged the SVG to the OM  with a LCB guiding catheter. When the ACT was over 200, I passed a Cougar IC wire down the SVG. I then changed out for a 4.0 spider distal protection device. A 2.5 x 12 mm Xience DES was deployed in the mid body of the SVG. The stenosis was taken from 99% down to 0%. I then retrieved the distal protection device.  Lesion #2 proximal Circumflex: The left main was engaged with a XB 3.0 guiding catheter. A Cougar IC wire was advanced down the native Circumflex. A 2.0 x 12 mm balloon was used to pre-dilate the proximal stenosis. I then carefully positioned and deployed a 2.5 x 28 mm Xience DES in the proximal Circumflex. The stent was post-dilated with a 2.75 x 20 mm Hughes balloon x 2. The stenosis was  taken from 90% down to 0%.  The sheath was removed from the right radial artery and a Terumo hemostasis band was applied at the arteriotomy site on the right wrist. An Angioseal femoral artery closure device was placed in the right femoral artery.  There were no immediate complications. The patient was taken to the recovery area in stable condition.  Hemodynamic Findings:  Central aortic pressure: 135/67  Left ventricular pressure: 144/10/17  Angiographic Findings:  Left main: Distal 20% stenosis.  Left Anterior Descending Artery: Large caliber vessel that courses to the apex. The mid vessel has diffuse 80% stenosis. The distal vessel fills from the patent IMA graft and from antegrade flow. The diagonal is occluded and fills from the patent vein graft.  Circumflex Artery: Large caliber vessel with 90% proximal stenosis, occluded OM1. The first OM fills from the patent vein graft (with flow compromised by severe stenosis in vein graft).  Right Coronary Artery: Large dominant vessel with 100% proximal occlusion. The distal vessel fills from the patent sequential vein graft.  Graft Anatomy:  SVG to OM is patent with 99% stenosis in the mid body of the graft  SVG to Diagonal is patent with ostial 40% stenosis  SVG sequential to PDA/PLA is patent  LIMA to mid LAD is patent  Left Ventricular Angiogram: Deferred.  Impression:  1. Severe triple vessel CAD s/p 5V CABG with 5/5 patent bypass grafts  2. Unstable angina secondary to severe stenosis SVG to OM and severe stenosis native Circumflex unprotected by grafting.  3. Successful PTCA/DES x 1 SVG to OM  4. Successful PTCA/DES x 1 proximal Circumflex  Recommendations: He will need dual antiplatelet therapy with ASA and Plavix for lifetime. D/c home in am if stable.  Complications: None. The patient tolerated the procedure well.     Discharge Medications     Medication List         amLODipine 5 MG tablet  Commonly known as:  NORVASC  Take 1  tablet (5 mg total) by mouth daily.     aspirin EC 81 MG tablet  Take 81 mg by mouth daily.     clopidogrel 75 MG tablet  Commonly known as:  PLAVIX  Take 1 tablet (75 mg total) by mouth daily with breakfast.     ezetimibe 10 MG tablet  Commonly known as:  ZETIA  Take 10 mg by mouth daily.     FARXIGA 5 MG Tabs  Generic drug:  Dapagliflozin Propanediol  Take 1 tablet by mouth daily.     Fish Oil 1000 MG Caps  Take 2 capsules by mouth daily.     hydrochlorothiazide 25 MG tablet  Commonly known as:  HYDRODIURIL  Take 25 mg by mouth daily.     metFORMIN 500 MG 24 hr tablet  Commonly known as:  GLUCOPHAGE-XR  Take 1 tablet (500 mg total) by mouth 2 (two) times daily. Take 3 tablets in AM and 2 tablets in PM  Start taking on:  11/05/2013     metoprolol 50 MG tablet  Commonly known as:  LOPRESSOR  Take 50 mg by mouth 2 (two) times daily.     nitroGLYCERIN 0.4 MG SL tablet  Commonly known as:  NITROSTAT  Place 1 tablet (0.4 mg total) under the tongue every 5 (five) minutes as needed for chest pain.     ramipril 10 MG capsule  Commonly known as:  ALTACE  Take 10 mg by mouth daily.     VICTOZA 18 MG/3ML Sopn  Generic drug:  Liraglutide  Inject 1.2 mg into the skin daily.        Disposition   The patient will be discharged in stable condition to home.  Future Appointments Provider Department Dept Phone   10/23/2013 8:30 AM [facility] [place] [phone]   10/25/2013 11:20 AM Imogene Burn, PA-C [place] [phone]     Follow-up Information   Follow up with [facility] On 10/25/2013. (@ 11:20am with Estella Husk PA-C)    Contact information:   [address] [place] [address]         Duration of Discharge Encounter: 40 minutes including physician and PA time , going over meds and the plan.  Signed, Perry Mount PA-C 10/20/2013, 9:17 AM   I have examined the patient and reviewed  assessment and plan and discussed with patient.  Agree with above as stated.  F/U with Dr. Bronson Ing.  Stressed importance of DAPT.   Jettie Booze

## 2013-10-20 NOTE — Progress Notes (Signed)
CARDIAC REHAB PHASE I   PRE:  Rate/Rhythm: 78 NSR w/ PVCs  BP:  Sitting: 159/89      SaO2: 96 RA  MODE:  Ambulation: 750 ft   POST:  Rate/Rhythm: 80 NSR w/ PVCs   BP:  Sitting: 172/88     SaO2: 95 RA  1443-1540 Patient ambulated independently with steady gait. Denied complaints. Education given with emphasis on Diabetes management, diet, and exercise. Patient stated he is limited in exercise by back pain. Pt lives in [place] and willing to be contacted by Phase II rehab program. Post ambulation, patient positioned in chair with call bell in reach and wife at bedside.  Treg Diemer English PayneRN, BSN 10/20/2013 9:12 AM

## 2013-10-20 NOTE — Progress Notes (Signed)
Patient Name: Colin Aguirre Date of Encounter: 10/20/2013     Active Problems:   Unstable angina    SUBJECTIVE  Feeling well. No CP or SOB. Ready to leave.   CURRENT MEDS . amLODipine  5 mg Oral Daily  . aspirin EC  81 mg Oral Daily  . clopidogrel  75 mg Oral Q breakfast  . ezetimibe  10 mg Oral Daily  . hydrochlorothiazide  25 mg Oral Daily  . Liraglutide  1.2 mg Subcutaneous Daily  . metoprolol  50 mg Oral BID  . ramipril  10 mg Oral Daily    OBJECTIVE  Filed Vitals:   10/19/13 1926 10/19/13 2000 10/19/13 2341 10/20/13 0410  BP: 162/60 145/68 116/73 131/61  Pulse: 62 67 63 72  Temp: 98.1 F (36.7 C)  97.5 F (36.4 C) 98 F (36.7 C)  TempSrc: Oral  Oral Oral  Resp: 17 18 18 18   Height: 6' (1.829 m)     Weight: 256 lb (116.12 kg)     SpO2: 97% 95% 93% 96%    Intake/Output Summary (Last 24 hours) at 10/20/13 0808 Last data filed at 10/19/13 2000  Gross per 24 hour  Intake 186.25 ml  Output      0 ml  Net 186.25 ml   Filed Weights   10/19/13 1007 10/19/13 1926  Weight: 256 lb (116.121 kg) 256 lb (116.12 kg)    PHYSICAL EXAM  General: Pleasant, NAD. Obese. Neuro: Alert and oriented X 3. Moves all extremities spontaneously. Psych: Normal affect. HEENT:  Normal  Neck: Supple without bruits or JVD. Lungs:  Resp regular and unlabored, CTA. Heart: RRR no s3, s4, or murmurs. Abdomen: Soft, non-tender, non-distended, BS + x 4.  Extremities: No clubbing, cyanosis or edema. DP/PT/Radials 2+ and equal bilaterally.  Accessory Clinical Findings  CBC  Recent Labs  10/19/13 1015 10/20/13 0555  WBC 7.9 7.7  HGB 14.9 13.4  HCT 44.4 40.0  MCV 87.9 88.7  PLT 199 324   Basic Metabolic Panel  Recent Labs  10/19/13 1015 10/20/13 0555  NA 138 144  K 4.0 4.1  CL 97 105  CO2 23 28  GLUCOSE 287* 209*  BUN 22 16  CREATININE 0.99 1.00  CALCIUM 9.4 8.8   TELE  NSR frequent PVCs  Radiology/Studies  Dg Chest 1 View  10/19/2013   CLINICAL  DATA:  Short of breath.  EXAM: CHEST - 1 VIEW  COMPARISON:  None.  FINDINGS: Status post CABG surgery. Cardiac silhouette is normal in size and configuration. Normal mediastinal and hilar contours. Clear lungs. No pleural effusion. No pneumothorax.  The bony thorax is intact.  IMPRESSION: No active disease.    Nm Myocar Single W/spect W/wall Motion And Ef  10/12/2013   CLINICAL DATA:  68 year old male with a known history of coronary artery disease and previous CABG referred for chest pain.  EXAM: MYOCARDIAL IMAGING WITH SPECT (REST AND PHARMACOLOGIC-STRESS)  GATED LEFT VENTRICULAR WALL MOTION STUDY  LEFT VENTRICULAR EJECTION FRACTION  TECHNIQUE: Standard myocardial SPECT imaging was performed after resting intravenous injection of 10 mCi Tc-60m sestamibi. Subsequently, intravenous infusion of Lexiscan was performed under the supervision of the Cardiology staff. At peak effect of the drug, 30 mCi Tc-36m sestamibi was injected intravenously and standard myocardial SPECT imaging was performed. Quantitative gated imaging was also performed to evaluate left ventricular wall motion, and estimate left ventricular ejection fraction.  COMPARISON:  None.  FINDINGS: Myocardial perfusion imaging  Baseline EKG showed normal sinus rhythm  with inferior Q-waves. After injection heart rate increased from 70 beats per min up to 105 beats per min and blood pressure decreased from 143/87 down to 126/80. The test was stopped after injection was complete, the patient did not experience any chest pain.  Post-injection EKG showed no ischemic changes, occasional PVCs.  Myocardial perfusion imaging  Raw images showed appropriate radiotracer uptake. There was a moderate-sized moderately reversible inferior wall defect. Gated imaging showed end-diastolic volume 016 mL, and systolic volume 75 mL, left ventricular ejection fraction 40%, t.i.d. 0.90. The inferior wall was hypokinetic.  IMPRESSION: 1.  Abnormal Lexiscan MPI for ischemia  2.  Moderate sized inferior wall infarct with moderate peri-infarct ischemia  3.  Decreased left ventricular systolic function, LVEF 55%  4. High risk study for major cardiac events based on low ejection fraction and moderate area of myocardium at jeopardy.     ASSESSMENT AND PLAN Colin Aguirre is a 68 y.o. male with a history of diabetes mellitus, CAD s/p CABG x5 in 2002, HTN, and HLD who presented for elective cardiac cath for class III unstable angina and a high risk stress myoview.  Unstable angina- He is now s/p PTCA/DES x 1 mid body of SVG to OM PTCA/DES x 1 proximal Circumflex Angioseal RFA    Signed, Perry Mount PA-C  Pager [phone]  I have examined the patient and reviewed assessment and plan and discussed with patient.  Agree with above as stated.  2+ right PT pulse.  No hematoma.  Join cardiac rehab bad DAPT for 1 year.   Jettie Booze

## 2013-10-23 ENCOUNTER — Ambulatory Visit (HOSPITAL_COMMUNITY)
Admission: RE | Admit: 2013-10-23 | Discharge: 2013-10-23 | Disposition: A | Discharge status: Home / Self Care | Payer: BC Managed Care – PPO | Source: Ambulatory Visit | Attending: Cardiovascular Disease | Admitting: Cardiovascular Disease

## 2013-10-23 DIAGNOSIS — E119 Type 2 diabetes mellitus without complications: Secondary | ICD-10-CM | POA: Insufficient documentation

## 2013-10-23 DIAGNOSIS — R931 Abnormal findings on diagnostic imaging of heart and coronary circulation: Secondary | ICD-10-CM

## 2013-10-23 DIAGNOSIS — I517 Cardiomegaly: Secondary | ICD-10-CM

## 2013-10-23 DIAGNOSIS — Z87891 Personal history of nicotine dependence: Secondary | ICD-10-CM | POA: Insufficient documentation

## 2013-10-23 DIAGNOSIS — I25810 Atherosclerosis of coronary artery bypass graft(s) without angina pectoris: Secondary | ICD-10-CM | POA: Insufficient documentation

## 2013-10-23 DIAGNOSIS — R9439 Abnormal result of other cardiovascular function study: Secondary | ICD-10-CM | POA: Insufficient documentation

## 2013-10-23 DIAGNOSIS — E785 Hyperlipidemia, unspecified: Secondary | ICD-10-CM | POA: Insufficient documentation

## 2013-10-23 DIAGNOSIS — I10 Essential (primary) hypertension: Secondary | ICD-10-CM | POA: Insufficient documentation

## 2013-10-23 DIAGNOSIS — R0602 Shortness of breath: Secondary | ICD-10-CM

## 2013-10-23 DIAGNOSIS — Z6834 Body mass index (BMI) 34.0-34.9, adult: Secondary | ICD-10-CM | POA: Insufficient documentation

## 2013-10-23 DIAGNOSIS — R0989 Other specified symptoms and signs involving the circulatory and respiratory systems: Secondary | ICD-10-CM | POA: Insufficient documentation

## 2013-10-23 DIAGNOSIS — R0609 Other forms of dyspnea: Secondary | ICD-10-CM | POA: Insufficient documentation

## 2013-10-23 DIAGNOSIS — R079 Chest pain, unspecified: Secondary | ICD-10-CM | POA: Insufficient documentation

## 2013-10-23 NOTE — Progress Notes (Signed)
  Echocardiogram 2D Echocardiogram has been performed.  Bock Jacque Garrels 10/23/2013, 9:09 AM

## 2013-10-25 ENCOUNTER — Ambulatory Visit (INDEPENDENT_AMBULATORY_CARE_PROVIDER_SITE_OTHER): Payer: BC Managed Care – PPO | Admitting: Physician Assistant

## 2013-10-25 ENCOUNTER — Encounter: Payer: Self-pay | Admitting: Physician Assistant

## 2013-10-25 ENCOUNTER — Encounter: Payer: BC Managed Care – PPO | Admitting: Physician Assistant

## 2013-10-25 VITALS — BP 116/83 | HR 91 | Ht 72.0 in | Wt 249.0 lb

## 2013-10-25 DIAGNOSIS — M545 Low back pain, unspecified: Secondary | ICD-10-CM | POA: Insufficient documentation

## 2013-10-25 DIAGNOSIS — I25810 Atherosclerosis of coronary artery bypass graft(s) without angina pectoris: Secondary | ICD-10-CM

## 2013-10-25 DIAGNOSIS — I10 Essential (primary) hypertension: Secondary | ICD-10-CM

## 2013-10-25 NOTE — Progress Notes (Signed)
HPI:  This is a 68 year old Colin Aguirre of Dr. Bronson Ing with history of coronary artery disease status post CABG x5 in 2002. He was admitted to the hospital with unstable angina on 10/19/13 and underwent PTCA/DES x1 of the mid-SVG to the OM, and drug-eluting stent x1 to the proximal circumflex. Followup 2-D echo to evaluate LV function EF was 40-45% with akinesis of the basal mid inferior lateral myocardium. See below for details.  The Colin Aguirre denies any chest pain since discharge. He says he gets a little out of breath with activity. He watches his salt closely. He denies any palpitations, dizziness, edema, or presyncope.   His main complaint is pain in his left hip around to his back. He says it's similar to when he had his urinary tract infection. He says it's more painful when he tries to walk and takes his breath away. He is to followup with Dr. Quintin Alto concerning this.  No Known Allergies  Current Outpatient Prescriptions on File Prior to Visit: amLODipine (NORVASC) 5 MG tablet, Take 1 tablet (5 mg total) by mouth daily., Disp: 180 tablet, Rfl: 3 aspirin EC 81 MG tablet, Take 81 mg by mouth daily., Disp: , Rfl:  clopidogrel (PLAVIX) 75 MG tablet, Take 1 tablet (75 mg total) by mouth daily with breakfast., Disp: 30 tablet, Rfl: 12 Dapagliflozin Propanediol (FARXIGA) 5 MG TABS, Take 1 tablet by mouth daily., Disp: , Rfl:  ezetimibe (ZETIA) 10 MG tablet, Take 10 mg by mouth daily., Disp: , Rfl:  hydrochlorothiazide (HYDRODIURIL) 25 MG tablet, Take 25 mg by mouth daily., Disp: , Rfl:  Liraglutide (VICTOZA) 18 MG/3ML SOPN, Inject 1.2 mg into the skin daily. , Disp: , Rfl:  [START ON 11/05/2013] metFORMIN (GLUCOPHAGE-XR) 500 MG 24 hr tablet, Take 1 tablet (500 mg total) by mouth 2 (two) times daily. Take 3 tablets in AM and 2 tablets in PM, Disp: 30 tablet, Rfl: 11 metoprolol (LOPRESSOR) 50 MG tablet, Take 50 mg by mouth 2 (two) times daily., Disp: , Rfl:  nitroGLYCERIN (NITROSTAT) 0.4 MG SL tablet,  Place 1 tablet (0.4 mg total) under the tongue every 5 (five) minutes as needed for chest pain., Disp: 25 tablet, Rfl: 3 Omega-3 Fatty Acids (FISH OIL) 1000 MG CAPS, Take 2 capsules by mouth daily., Disp: , Rfl:  ramipril (ALTACE) 10 MG capsule, Take 10 mg by mouth daily., Disp: , Rfl:   No current facility-administered medications on file prior to visit.   Past Medical History:   Diabetes mellitus                                            Hyperlipidemia                                               Hypertension                                                 CAD (coronary artery disease)  Comment:H/O MI AND CABG  Past Surgical History:   CORONARY ARTERY BYPASS GRAFT                                 Review of Colin Aguirre's family history indicates:   Heart disease                  Father                   Breast cancer                  Mother                   Social History   Marital Status: Married             Spouse Name:                      Years of Education:                 Number of children:             Occupational History   None on file  Social History Main Topics   Smoking Status: Former Smoker                   Packs/Day: 1.00  Years: 32        Types: Cigarettes     Quit date: 06/23/2003   Smokeless Status: Not on file                      Alcohol Use: Not on file    Drug Use: Not on file    Sexual Activity: Not on file        Other Topics            Concern   None on file  Social History Narrative   None on file    ROS: See history of present illness otherwise negative   PHYSICAL EXAM: Obese, in no acute distress. Neck: No JVD, HJR, Bruit, or thyroid enlargement  Lungs: No tachypnea, clear without wheezing, rales, or rhonchi  Cardiovascular: RRR, PMI not displaced, heart sounds distant, no murmurs, gallops, bruit, thrill, or heave.  Abdomen: BS normal. Soft without organomegaly, masses, lesions or  tenderness.  Extremities: Left radial pulse and right femoral pulse without hematoma or hemorrhage at cath site good brachial and distal pulses. Lower extremities without cyanosis, clubbing or edema. Good distal pulses bilateral  SKin: Multiple skin tags that need to be looked at Warm, no rashes   Musculoskeletal: No deformities  Neuro: no focal signs  BP 116/83  Pulse 91  Ht 6' (1.829 m)  Wt 249 lb (112.946 kg)  BMI 33.76 kg/m2    EKG: Normal sinus rhythm, old inferior MI,no acute change  Left heart catheterization 10/19/13 Impression: 1. Severe triple vessel CAD s/p 5V CABG with 5/5 patent bypass grafts 2. Unstable angina secondary to severe stenosis SVG to OM and severe stenosis native Circumflex unprotected by grafting.   3. Successful PTCA/DES x 1 SVG to OM 4. Successful PTCA/DES x 1 proximal Circumflex  Recommendations: He will need dual antiplatelet therapy with ASA and Plavix for lifetime. D/c home in am if stable.            2-D echo 10/23/13 Study Conclusions  - Left ventricle: The cavity size was  normal. Wall thickness   was increased in a pattern of mild LVH. Systolic function   was mildly to moderately reduced. The estimated ejection   fraction was in the range of 40% to 45%. There is akinesis   of the basal-midinferolateral myocardium. Doppler   parameters are consistent with abnormal left ventricular   relaxation (grade 1 diastolic dysfunction). - Aortic valve: Mildly to moderately calcified annulus.   Trileaflet. Moderate thickening involving the noncoronary   cusp. No significant regurgitation. Mean gradient: 54mm Hg   (S). - Mitral valve: Calcified annulus. Mildly thickened leaflets   . Trivial regurgitation. - Left atrium: The atrium was moderately dilated. - Right ventricle: The cavity size was mildly dilated.   Systolic function was mildly reduced. - Right atrium: The atrium was mildly dilated. - Atrial septum: There was increased thickness of the    septum, consistent with lipomatous hypertrophy. - Tricuspid valve: Peak RV-RA gradient: 76mm Hg (S). - Inferior vena cava: Not well visualized. Unable to   estimate CVP. - Pericardium, extracardiac: There was no pericardial   effusion. Impressions:  - Mild LVH with LVEF 40-45%, inferolateral akinesis   consistent with ischemic heart disease. Grade 1 diastolic   dysfunction. Moderate left atrial enlargement. MAC with   mitral leaflet thickening. Sclerotic aortic valve without   stenosis. Mild RV dilatation with mildly reduced   contraction. Upper normal RV-RA gradient 31 mmHg.

## 2013-10-25 NOTE — Patient Instructions (Signed)
Your physician recommends that you schedule a follow-up appointment in: 2 months with Dr Bronson Ing   Your physician recommends that you continue on your current medications as directed. Please refer to the Current Medication list given to you today.

## 2013-10-25 NOTE — Assessment & Plan Note (Signed)
Blood pressure stable ? ?

## 2013-10-25 NOTE — Addendum Note (Signed)
Addended by: Truett Mainland on: 10/25/2013 05:40 PM   Modules accepted: Orders

## 2013-10-25 NOTE — Assessment & Plan Note (Signed)
Stable status post drug-eluting stent to the SVG to the OM and drug-eluting stent to the proximal circumflex. Continue DAPT

## 2013-10-25 NOTE — Assessment & Plan Note (Signed)
Patient had recent UTI with similar symptoms. I've asked him to contact Dr. Quintin Alto for further evaluation of left hip and back pain.

## 2013-11-03 ENCOUNTER — Ambulatory Visit: Payer: BC Managed Care – PPO | Admitting: Cardiovascular Disease

## 2013-12-12 ENCOUNTER — Ambulatory Visit: Payer: BC Managed Care – PPO | Admitting: Cardiovascular Disease

## 2013-12-13 ENCOUNTER — Encounter: Payer: Self-pay | Admitting: Cardiovascular Disease

## 2013-12-13 ENCOUNTER — Ambulatory Visit (INDEPENDENT_AMBULATORY_CARE_PROVIDER_SITE_OTHER): Payer: BC Managed Care – PPO | Admitting: Cardiovascular Disease

## 2013-12-13 VITALS — BP 122/66 | HR 64 | Ht 72.0 in | Wt 250.5 lb

## 2013-12-13 DIAGNOSIS — E785 Hyperlipidemia, unspecified: Secondary | ICD-10-CM

## 2013-12-13 DIAGNOSIS — I25810 Atherosclerosis of coronary artery bypass graft(s) without angina pectoris: Secondary | ICD-10-CM

## 2013-12-13 DIAGNOSIS — I10 Essential (primary) hypertension: Secondary | ICD-10-CM

## 2013-12-13 DIAGNOSIS — Z9861 Coronary angioplasty status: Secondary | ICD-10-CM

## 2013-12-13 DIAGNOSIS — Z955 Presence of coronary angioplasty implant and graft: Secondary | ICD-10-CM

## 2013-12-13 MED ORDER — RAMIPRIL 10 MG PO CAPS: 10.0000 mg | ORAL_CAPSULE | Freq: Every day | ORAL | Status: DC

## 2013-12-13 MED ORDER — EZETIMIBE 10 MG PO TABS: 10.0000 mg | ORAL_TABLET | Freq: Every day | ORAL | Status: DC

## 2013-12-13 MED ORDER — CLOPIDOGREL BISULFATE 75 MG PO TABS: 75.0000 mg | ORAL_TABLET | Freq: Every day | ORAL | Status: DC

## 2013-12-13 MED ORDER — HYDROCHLOROTHIAZIDE 25 MG PO TABS: 25.0000 mg | ORAL_TABLET | Freq: Every day | ORAL | Status: DC

## 2013-12-13 MED ORDER — AMLODIPINE BESYLATE 5 MG PO TABS: 5.0000 mg | ORAL_TABLET | Freq: Every day | ORAL | Status: DC

## 2013-12-13 MED ORDER — METOPROLOL TARTRATE 50 MG PO TABS: 50.0000 mg | ORAL_TABLET | Freq: Two times a day (BID) | ORAL | Status: DC

## 2013-12-13 NOTE — Progress Notes (Signed)
Patient ID: Colin Aguirre, male   DOB: [birth year], 68 y.o.   MRN: [id number]      SUBJECTIVE: The patient is here to follow up after undergoing coronary angiography with drug-eluting stent placement to the midportion of the saphenous vein graft to the obtuse marginal as well as drug-eluting stent placement to the native proximal left circumflex coronary artery. Echocardiography demonstrated mildly reduced LV systolic function, EF 32-44%, with inferolateral akinesis and grade I diastolic dysfunction.  He has been doing well from a cardiovascular standpoint, denying chest pain, exertional dyspnea, palpitations, dizziness and leg swelling. 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 He had been feeling slightly run down but after his metoprolol was reduced to 25 mg twice daily, he felt much better.    No Known Allergies  Current Outpatient Prescriptions  Medication Sig Dispense Refill  . amLODipine (NORVASC) 5 MG tablet Take 1 tablet (5 mg total) by mouth daily.  90 tablet  3  . aspirin EC 81 MG tablet Take 81 mg by mouth daily.      . clopidogrel (PLAVIX) 75 MG tablet Take 1 tablet (75 mg total) by mouth daily with breakfast.  90 tablet  3  . Dapagliflozin Propanediol (FARXIGA) 5 MG TABS Take 1 tablet by mouth daily.      Marland Kitchen ezetimibe (ZETIA) 10 MG tablet Take 1 tablet (10 mg total) by mouth daily.  90 tablet  3  . hydrochlorothiazide (HYDRODIURIL) 25 MG tablet Take 1 tablet (25 mg total) by mouth daily.  90 tablet  3  . Liraglutide (VICTOZA) 18 MG/3ML SOPN Inject 1.2 mg into the skin daily.       . metFORMIN (GLUCOPHAGE-XR) 500 MG 24 hr tablet Take 1 tablet (500 mg total) by mouth 2 (two) times daily. Take 3 tablets in AM and 2 tablets in PM  30 tablet  11  . metoprolol (LOPRESSOR) 50 MG tablet Take 1 tablet (50 mg total) by mouth 2 (two) times daily.  180 tablet  3  . nitroGLYCERIN (NITROSTAT) 0.4 MG SL tablet Place 1 tablet (0.4 mg total) under the  tongue every 5 (five) minutes as needed for chest pain.  25 tablet  3  . Omega-3 Fatty Acids (FISH OIL) 1000 MG CAPS Take 2 capsules by mouth daily.      . ramipril (ALTACE) 10 MG capsule Take 1 capsule (10 mg total) by mouth daily.  90 capsule  3   No current facility-administered medications for this visit.    Past Medical History  Diagnosis Date  . Diabetes mellitus   . Hyperlipidemia   . Hypertension   . CAD (coronary artery disease)     H/O MI AND CABG    Past Surgical History  Procedure Laterality Date  . Coronary artery bypass graft      History   Social History  . Marital Status: Married    Spouse Name: N/A    Number of Children: N/A  . Years of Education: N/A   Occupational History  . Not on file.   Social History Main Topics  . Smoking status: Former Smoker -- 1.00 packs/day for 44 years    Types: Cigarettes    Quit date: 06/23/2003  . Smokeless tobacco: Not on file  . Alcohol Use: Not on file  . Drug Use: Not on file  . Sexual Activity: Not on file   Other Topics Concern  . Not on file   Social History Narrative  .  No narrative on file     Filed Vitals:   12/13/13 0837  BP: 122/66  Pulse: 64  Height: 6' (1.829 m)  Weight: 250 lb 8 oz (113.626 kg)    PHYSICAL EXAM General: NAD Neck: No JVD, no thyromegaly. Lungs: Clear to auscultation bilaterally with normal respiratory effort. CV: Nondisplaced PMI.  Regular rate and rhythm, normal S1/S2, no S3/S4, no murmur. No pretibial or periankle edema.  No carotid bruit.  Normal pedal pulses.  Abdomen: Soft, nontender, no hepatosplenomegaly, no distention.  Neurologic: Alert and oriented x 3.  Psych: Normal affect. Extremities: No clubbing or cyanosis.   ECG: reviewed and available in electronic records.      ASSESSMENT AND PLAN: 1. CAD/CABG s/p recent DES x 2 to mid SVG to OM and proximal circumflex: Symptomatically stable. Continue ASA, Plavix, metoprolol, and Zetia. 2. HTN: Controlled on  current therapy which includes ramipril, HCTZ, and amlodipine. 3. Hyperlipidemia: Lipids on 4/9 demonstrated LDL 99, HDL 27, TG 495. Continue Zetia and fish oil. Lifestyle modifications limited by left hip pain (used to walk regularly). I offered obtaining xrays but he declined. Will follow up with Dr. Quintin Alto.  Dispo: f/u 6 months.  Kate Sable, M.D., F.A.C.C.

## 2013-12-13 NOTE — Patient Instructions (Signed)
Your physician recommends that you schedule a follow-up appointment in: 6 months with Dr Koneswaran You will receive a reminder letter two months in advance reminding you to call and schedule your appointment. If you don't receive this letter, please contact our office.  Your physician recommends that you continue on your current medications as directed. Please refer to the Current Medication list given to you today.   

## 2014-05-29 ENCOUNTER — Encounter: Payer: Self-pay | Admitting: Cardiovascular Disease

## 2014-05-29 ENCOUNTER — Ambulatory Visit (INDEPENDENT_AMBULATORY_CARE_PROVIDER_SITE_OTHER): Payer: BC Managed Care – PPO | Admitting: Cardiovascular Disease

## 2014-05-29 VITALS — BP 152/80 | HR 62 | Ht 72.0 in | Wt 248.2 lb

## 2014-05-29 DIAGNOSIS — E119 Type 2 diabetes mellitus without complications: Secondary | ICD-10-CM

## 2014-05-29 DIAGNOSIS — I10 Essential (primary) hypertension: Secondary | ICD-10-CM

## 2014-05-29 DIAGNOSIS — I25810 Atherosclerosis of coronary artery bypass graft(s) without angina pectoris: Secondary | ICD-10-CM

## 2014-05-29 DIAGNOSIS — E785 Hyperlipidemia, unspecified: Secondary | ICD-10-CM

## 2014-05-29 DIAGNOSIS — R350 Frequency of micturition: Secondary | ICD-10-CM

## 2014-05-29 MED ORDER — AMLODIPINE BESYLATE 10 MG PO TABS: 10.0000 mg | ORAL_TABLET | Freq: Every day | ORAL | Status: DC

## 2014-05-29 NOTE — Patient Instructions (Signed)
Your physician wants you to follow-up in: 4 months You will receive a reminder letter in the mail two months in advance. If you don't receive a letter, please call our office to schedule the follow-up appointment.     INCREASE Amlodipine to 10 mg daily      Thank you for choosing [facility] !

## 2014-05-29 NOTE — Progress Notes (Signed)
Patient ID: Colin Aguirre, male   DOB: December 19, 1945, 68 y.o.   MRN: [id number]      SUBJECTIVE: The patient presents for routine follow up. In April 2015, he underwent drug-eluting stent placement to the midportion of the saphenous vein graft to the obtuse marginal as well as drug-eluting stent placement to the native proximal left circumflex coronary artery. Echocardiography demonstrated mildly reduced LV systolic function, EF 86-76%, with inferolateral akinesis and grade I diastolic dysfunction. He very seldom experiences chest discomfort, which has not required the use of SL nitroglycerin.  He has been urinating frequently (every 30-60 minutes) when taking HCTZ and Farxiga together in the mornings. He denies orthopnea, PND, and leg swelling.   Review of Systems: As per "subjective", otherwise negative.  No Known Allergies  Current Outpatient Prescriptions  Medication Sig Dispense Refill  . amLODipine (NORVASC) 5 MG tablet Take 1 tablet (5 mg total) by mouth daily. 90 tablet 3  . aspirin EC 81 MG tablet Take 81 mg by mouth daily.    . clopidogrel (PLAVIX) 75 MG tablet Take 1 tablet (75 mg total) by mouth daily with breakfast. 90 tablet 3  . Dapagliflozin Propanediol (FARXIGA) 5 MG TABS Take 1 tablet by mouth daily.    Marland Kitchen ezetimibe (ZETIA) 10 MG tablet Take 1 tablet (10 mg total) by mouth daily. 90 tablet 3  . hydrochlorothiazide (HYDRODIURIL) 25 MG tablet Take 1 tablet (25 mg total) by mouth daily. 90 tablet 3  . Liraglutide (VICTOZA) 18 MG/3ML SOPN Inject 1.2 mg into the skin daily.     . metFORMIN (GLUCOPHAGE-XR) 500 MG 24 hr tablet Take 1 tablet (500 mg total) by mouth 2 (two) times daily. Take 3 tablets in AM and 2 tablets in PM 30 tablet 11  . metoprolol tartrate (LOPRESSOR) 25 MG tablet Take 25 mg by mouth 2 (two) times daily.    . nitroGLYCERIN (NITROSTAT) 0.4 MG SL tablet Place 1 tablet (0.4 mg total) under the tongue every 5 (five) minutes as needed for chest pain. 25 tablet 3    . Omega-3 Fatty Acids (FISH OIL) 1000 MG CAPS Take 2 capsules by mouth daily.    . ramipril (ALTACE) 10 MG capsule Take 1 capsule (10 mg total) by mouth daily. 90 capsule 3   No current facility-administered medications for this visit.    Past Medical History  Diagnosis Date  . Diabetes mellitus   . Hyperlipidemia   . Hypertension   . CAD (coronary artery disease)     H/O MI AND CABG    Past Surgical History  Procedure Laterality Date  . Coronary artery bypass graft      History   Social History  . Marital Status: Married    Spouse Name: N/A    Number of Children: N/A  . Years of Education: N/A   Occupational History  . Not on file.   Social History Main Topics  . Smoking status: Former Smoker -- 1.00 packs/day for 44 years    Types: Cigarettes    Quit date: 06/22/2000  . Smokeless tobacco: Not on file  . Alcohol Use: Not on file  . Drug Use: Not on file  . Sexual Activity: Not on file   Other Topics Concern  . Not on file   Social History Narrative     Filed Vitals:   05/29/14 0815  BP: 152/80  Pulse: 62  Height: 6' (1.829 m)  Weight: 248 lb 3.2 oz (112.583 kg)    PHYSICAL EXAM  General: NAD HEENT: Normal. Neck: No JVD, no thyromegaly. Lungs: Clear to auscultation bilaterally with normal respiratory effort. CV: Nondisplaced PMI.  Regular rate and rhythm, normal S1/S2, no S3/S4, no murmur. No pretibial or periankle edema.  No carotid bruit.  Normal pedal pulses.  Abdomen: Soft, nontender, no hepatosplenomegaly, no distention.  Neurologic: Alert and oriented x 3.  Psych: Normal affect. Skin: Normal. Musculoskeletal: Normal range of motion, no gross deformities. Extremities: No clubbing or cyanosis.   ECG: Most recent ECG reviewed.      ASSESSMENT AND PLAN: 1. CAD/CABG s/p recent DES x 2 to mid SVG to OM and proximal circumflex: Symptomatically stable. Continue ASA, Plavix, metoprolol, and Zetia. 2. Essential hypertension: Elevated on current  therapy which includes ramipril, HCTZ, and amlodipine. Will increase amlodipine to 10 mg daily. He would prefer to take HCTZ in the evening, and not simultaneously with the [place]. 3. Hyperlipidemia: Lipids on 4/9 demonstrated LDL 99, HDL 27, TG 495. Continue Zetia and fish oil. Lifestyle modifications limited by left hip pain (used to walk regularly).  Dispo: f/u 6 months.  Kate Sable, M.D., F.A.C.C.

## 2014-05-31 ENCOUNTER — Encounter (HOSPITAL_COMMUNITY): Payer: Self-pay | Admitting: Cardiovascular Disease

## 2014-08-08 ENCOUNTER — Encounter: Payer: Self-pay | Admitting: Cardiovascular Disease

## 2014-09-26 ENCOUNTER — Ambulatory Visit: Payer: Self-pay | Admitting: Cardiovascular Disease

## 2014-10-01 ENCOUNTER — Encounter: Payer: Self-pay | Admitting: Cardiovascular Disease

## 2014-10-01 ENCOUNTER — Ambulatory Visit (INDEPENDENT_AMBULATORY_CARE_PROVIDER_SITE_OTHER): Payer: BLUE CROSS/BLUE SHIELD | Admitting: Cardiovascular Disease

## 2014-10-01 VITALS — BP 128/66 | HR 60 | Ht 72.0 in | Wt 248.0 lb

## 2014-10-01 DIAGNOSIS — E119 Type 2 diabetes mellitus without complications: Secondary | ICD-10-CM | POA: Diagnosis not present

## 2014-10-01 DIAGNOSIS — I10 Essential (primary) hypertension: Secondary | ICD-10-CM

## 2014-10-01 DIAGNOSIS — I25810 Atherosclerosis of coronary artery bypass graft(s) without angina pectoris: Secondary | ICD-10-CM

## 2014-10-01 DIAGNOSIS — E785 Hyperlipidemia, unspecified: Secondary | ICD-10-CM | POA: Diagnosis not present

## 2014-10-01 NOTE — Progress Notes (Signed)
Patient ID: Colin Aguirre, male   DOB: 07-16-1945, 69 y.o.   MRN: [id number]      SUBJECTIVE: The patient presents for routine follow up. In April 2015, he underwent drug-eluting stent placement to the midportion of the saphenous vein graft to the obtuse marginal as well as drug-eluting stent placement to the native proximal left circumflex coronary artery. Echocardiography demonstrated mildly reduced LV systolic function, EF 31-54%, with inferolateral akinesis and grade I diastolic dysfunction. He is feeling well and denies chest pain. He has some chronic dyspnea which is unchanged. Denies leg swelling, dizziness, and syncope, as well as bleeding problems.   Review of Systems: As per "subjective", otherwise negative.  No Known Allergies  Current Outpatient Prescriptions  Medication Sig Dispense Refill  . amLODipine (NORVASC) 10 MG tablet Take 1 tablet (10 mg total) by mouth daily. 90 tablet 3  . aspirin EC 81 MG tablet Take 81 mg by mouth daily.    . clopidogrel (PLAVIX) 75 MG tablet Take 1 tablet (75 mg total) by mouth daily with breakfast. 90 tablet 3  . Dapagliflozin Propanediol (FARXIGA) 5 MG TABS Take 1 tablet by mouth daily.    Marland Kitchen ezetimibe (ZETIA) 10 MG tablet Take 1 tablet (10 mg total) by mouth daily. 90 tablet 3  . hydrochlorothiazide (HYDRODIURIL) 25 MG tablet Take 1 tablet (25 mg total) by mouth daily. 90 tablet 3  . metFORMIN (GLUCOPHAGE-XR) 500 MG 24 hr tablet Take 1 tablet (500 mg total) by mouth 2 (two) times daily. Take 3 tablets in AM and 2 tablets in PM 30 tablet 11  . metoprolol tartrate (LOPRESSOR) 25 MG tablet Take 25 mg by mouth 2 (two) times daily.    . nitroGLYCERIN (NITROSTAT) 0.4 MG SL tablet Place 1 tablet (0.4 mg total) under the tongue every 5 (five) minutes as needed for chest pain. 25 tablet 3  . Omega-3 Fatty Acids (FISH OIL) 1000 MG CAPS Take 2 capsules by mouth daily.    . ramipril (ALTACE) 10 MG capsule Take 1 capsule (10 mg total) by mouth daily. 90  capsule 3   No current facility-administered medications for this visit.    Past Medical History  Diagnosis Date  . Diabetes mellitus   . Hyperlipidemia   . Hypertension   . CAD (coronary artery disease)     H/O MI AND CABG    Past Surgical History  Procedure Laterality Date  . Coronary artery bypass graft    . Left heart catheterization with coronary/graft angiogram N/A 10/19/2013    Procedure: LEFT HEART CATHETERIZATION WITH [name];  Surgeon: [name], MD;  Location: [facility] CATH LAB;  Service: Cardiovascular;  Laterality: N/A;  . Percutaneous coronary stent intervention (pci-s)  10/19/2013    Procedure: PERCUTANEOUS CORONARY STENT INTERVENTION (PCI-S);  Surgeon: [name], MD;  Location: [facility] CATH LAB;  Service: Cardiovascular;;    History   Social History  . Marital Status: Married    Spouse Name: N/A  . Number of Children: N/A  . Years of Education: N/A   Occupational History  . Not on file.   Social History Main Topics  . Smoking status: Former Smoker -- 1.00 packs/day for 44 years    Types: Cigarettes    Start date: 06/22/1961    Quit date: 06/22/2000  . Smokeless tobacco: Not on file  . Alcohol Use: Not on file  . Drug Use: Not on file  . Sexual Activity: Not on file   Other Topics Concern  .  Not on file   Social History Narrative     Filed Vitals:   10/01/14 0845  BP: 128/66  Pulse: 60  Height: 6' (1.829 m)  Weight: 248 lb (112.492 kg)  SpO2: 95%    PHYSICAL EXAM General: NAD HEENT: Normal. Neck: No JVD, no thyromegaly. Lungs: Clear to auscultation bilaterally with normal respiratory effort. CV: Nondisplaced PMI.  Regular rate and rhythm, normal S1/S2, no S3/S4, no murmur. No pretibial or periankle edema.  No carotid bruit.  Normal pedal pulses.  Abdomen: Soft, nontender, obese, no distention.  Neurologic: Alert and oriented x 3.  Psych: Normal affect. Skin: Normal. Musculoskeletal: Normal range of  motion, no gross deformities. Extremities: No clubbing or cyanosis.   ECG: Most recent ECG reviewed.      ASSESSMENT AND PLAN: 1. CAD/CABG s/p recent DES x 2 to mid SVG to OM and proximal circumflex: Symptomatically stable. Continue ASA, Plavix, metoprolol, and Zetia. 2. Essential hypertension: Controlled on current therapy which includes ramipril, HCTZ, and amlodipine.  3. Hyperlipidemia: Obtain copy of lipid panel from PCP. Continue Zetia and fish oil.   Dispo: f/u 6 months.   Kate Sable, M.D., F.A.C.C.

## 2014-10-01 NOTE — Patient Instructions (Signed)

## 2014-10-30 ENCOUNTER — Other Ambulatory Visit: Payer: Self-pay | Admitting: Cardiovascular Disease

## 2014-10-31 ENCOUNTER — Other Ambulatory Visit: Payer: Self-pay | Admitting: Cardiovascular Disease

## 2014-10-31 MED ORDER — METOPROLOL TARTRATE 25 MG PO TABS: 25.0000 mg | ORAL_TABLET | Freq: Two times a day (BID) | ORAL | Status: DC

## 2015-01-17 ENCOUNTER — Other Ambulatory Visit: Payer: Self-pay | Admitting: Cardiovascular Disease

## 2015-04-01 ENCOUNTER — Ambulatory Visit (INDEPENDENT_AMBULATORY_CARE_PROVIDER_SITE_OTHER): Payer: BLUE CROSS/BLUE SHIELD | Admitting: Cardiovascular Disease

## 2015-04-01 ENCOUNTER — Encounter: Payer: Self-pay | Admitting: Cardiovascular Disease

## 2015-04-01 VITALS — BP 140/78 | HR 74 | Ht 72.0 in | Wt 245.0 lb

## 2015-04-01 DIAGNOSIS — E785 Hyperlipidemia, unspecified: Secondary | ICD-10-CM

## 2015-04-01 DIAGNOSIS — E1169 Type 2 diabetes mellitus with other specified complication: Secondary | ICD-10-CM | POA: Diagnosis not present

## 2015-04-01 DIAGNOSIS — I25810 Atherosclerosis of coronary artery bypass graft(s) without angina pectoris: Secondary | ICD-10-CM | POA: Diagnosis not present

## 2015-04-01 DIAGNOSIS — I10 Essential (primary) hypertension: Secondary | ICD-10-CM

## 2015-04-01 MED ORDER — SIMVASTATIN 10 MG PO TABS: 10.0000 mg | ORAL_TABLET | Freq: Every day | ORAL | Status: DC

## 2015-04-01 NOTE — Patient Instructions (Signed)
   Begin Simvastatin 10mg  daily - new sent to [facility] all other medications.   Lab for Lipids - due in 3 months - will mail reminder. Your physician wants you to follow up in:  1 year.  You will receive a reminder letter in the mail one-two months in advance.  If you don't receive a letter, please call our office to schedule the follow up appointment

## 2015-04-01 NOTE — Progress Notes (Signed)
Patient ID: Colin Aguirre, male   DOB: Jun 19, 1946, 69 y.o.   MRN: [id number]      SUBJECTIVE: The patient presents for routine follow up. In April 2015, he underwent drug-eluting stent placement to the midportion of the saphenous vein graft to the obtuse marginal as well as drug-eluting stent placement to the native proximal left circumflex coronary artery. Echocardiography demonstrated mildly reduced LV systolic function, EF 82-99%, with inferolateral akinesis and grade I diastolic dysfunction. He is feeling well and denies chest pain. He has some chronic dyspnea which is unchanged. Denies leg swelling, dizziness, and syncope. Has some hand arthritic pain. Occasionally skips HCTZ when he has a long roadtrip.  Lipids performed in April 2016 are reviewed below.  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   Review of Systems: As per "subjective", otherwise negative.  No Known Allergies  Current Outpatient Prescriptions  Medication Sig Dispense Refill  . amLODipine (NORVASC) 10 MG tablet Take 1 tablet (10 mg total) by mouth daily. 90 tablet 3  . aspirin EC 81 MG tablet Take 81 mg by mouth daily.    . clopidogrel (PLAVIX) 75 MG tablet TAKE 1 TABLET DAILY WITH BREAKFAST 90 tablet 2  . Dapagliflozin Propanediol (FARXIGA) 5 MG TABS Take 1 tablet by mouth daily.    Marland Kitchen ezetimibe (ZETIA) 10 MG tablet Take 1 tablet (10 mg total) by mouth daily. 90 tablet 3  . hydrochlorothiazide (HYDRODIURIL) 25 MG tablet TAKE 1 TABLET DAILY 90 tablet 2  . metFORMIN (GLUCOPHAGE-XR) 500 MG 24 hr tablet Take 1 tablet (500 mg total) by mouth 2 (two) times daily. Take 3 tablets in AM and 2 tablets in PM 30 tablet 11  . metoprolol tartrate (LOPRESSOR) 25 MG tablet Take 1 tablet (25 mg total) by mouth 2 (two) times daily. 60 tablet 6  . nitroGLYCERIN (NITROSTAT) 0.4 MG SL tablet Place 1  tablet (0.4 mg total) under the tongue every 5 (five) minutes as needed for chest pain. 25 tablet 3  . Omega-3 Fatty Acids (FISH OIL) 1000 MG CAPS Take 2 capsules by mouth daily.    . ramipril (ALTACE) 10 MG capsule TAKE 1 CAPSULE DAILY 90 capsule 2   No current facility-administered medications for this visit.    Past Medical History  Diagnosis Date  . Diabetes mellitus ([place])   . Hyperlipidemia   . Hypertension   . CAD (coronary artery disease)     H/O MI AND CABG    Past Surgical History  Procedure Laterality Date  . Coronary artery bypass graft    . Left heart catheterization with coronary/graft angiogram N/A 10/19/2013    Procedure: LEFT HEART CATHETERIZATION WITH [name];  Surgeon: [name], MD;  Location: [facility] CATH LAB;  Service: Cardiovascular;  Laterality: N/A;  . Percutaneous coronary stent intervention (pci-s)  10/19/2013    Procedure: PERCUTANEOUS CORONARY STENT INTERVENTION (PCI-S);  Surgeon: [name], MD;  Location: [facility] CATH LAB;  Service: Cardiovascular;;    Social History   Social History  . Marital Status: Married    Spouse Name: N/A  . Number of Children: N/A  . Years of Education: N/A   Occupational History  . Not on file.   Social History Main Topics  . Smoking status: Former Smoker -- 1.00 packs/day for 44 years    Types: Cigarettes    Start date: 06/22/1961    Quit date:  06/22/2000  . Smokeless tobacco: Never Used  . Alcohol Use: Not on file  . Drug Use: Not on file  . Sexual Activity: Not on file   Other Topics Concern  . Not on file   Social History Narrative     Filed Vitals:   04/01/15 0822  BP: 140/78  Pulse: 74  Height: 6' (1.829 m)  Weight: 245 lb (111.131 kg)  SpO2: 98%    PHYSICAL EXAM General: NAD HEENT: Normal. Neck: No JVD, no thyromegaly. Lungs: Clear to auscultation bilaterally with normal respiratory effort. CV: Nondisplaced PMI. Regular rate and rhythm, normal S1/S2, no  S3/S4, no murmur. No pretibial or periankle edema. No carotid bruit. Abdomen: Soft, nontender, obese, no distention.  Neurologic: Alert and oriented x 3.  Psych: Normal affect. Skin: Normal. Musculoskeletal: Normal range of motion, no gross deformities. Extremities: No clubbing or cyanosis.   ECG: Most recent ECG reviewed.      ASSESSMENT AND PLAN: 1. CAD/CABG s/p recent DES x 2 to mid SVG to OM and proximal circumflex: Symptomatically stable. Continue ASA, Plavix (will continue DAPT indefinitely), metoprolol, and Zetia. Will attempt low dose statin therapy with simvastatin 10 mg.  2. Essential hypertension: Borderline controlled on current therapy which includes ramipril, HCTZ, and amlodipine. No changes.  3. Dyslipidemia: Lipids on 09/21/14 showed total cholesterol 224, triglycerides 621, HDL 27, LDL could not be calculated. Taking Zetia and fish oil. Will attempt low dose statin therapy with simvastatin 10 mg and check lipids in 3 months.  Dispo: f/u 1 year.  Kate Sable, M.D., F.A.C.C.

## 2015-04-26 ENCOUNTER — Telehealth: Payer: Self-pay | Admitting: *Deleted

## 2015-04-26 NOTE — Telephone Encounter (Signed)
Received fax from [place] regarding adverse drug reaction between the Simvastatin and Gemfibrozil.   Simvastatin and Gemfibrozil - concomitant use should be avoided due to an increased risk of skeletal muscle effects, including myopathy and rhabdomyolysis  Medication list reviewed & Gemfibrozil was not found on his list.    Call placed to patient for clarification on all medications.  Patient stated that he in fact is on Gemfibrozil 600mg  twice a day and has been on this for some time.  Was started by his PMD.  Medication list was also updated with a few changes.    Patient advised to continue all medications the same for now.  Message will be sent to Dr. Bronson Ing for further instructions.  Patient verbalized understanding.

## 2015-04-28 IMAGING — NM NM MYOCAR SINGLE W/SPECT W/WALL MOTION & EF
1 series · 6 of 6 positions shown · non-contrast
Comparison: None.

CLINICAL DATA: 68-year-old male with a known history of coronary
artery disease and previous CABG referred for chest pain.

EXAM:
MYOCARDIAL IMAGING WITH SPECT (REST AND PHARMACOLOGIC-STRESS)
GATED LEFT VENTRICULAR WALL MOTION STUDY
LEFT VENTRICULAR EJECTION FRACTION
TECHNIQUE: Standard myocardial SPECT imaging was performed after resting
intravenous injection of 10 mCi Dc-33m sestamibi. Subsequently,
intravenous infusion of Lexiscan was performed under the supervision
of the Cardiology staff. At peak effect of the drug, 30 mCi Dc-33m
sestamibi was injected intravenously and standard myocardial SPECT
imaging was performed. Quantitative gated imaging was also performed
to evaluate left ventricular wall motion, and estimate left
ventricular ejection fraction.

[cardiac rest stress · 6.39mm/px · 6 of 64 frames shown]
[frame 6/64]
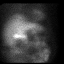
[frame 16/64]
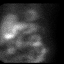
[frame 27/64]
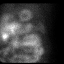
[frame 38/64]
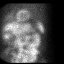
[frame 48/64]
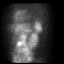
[frame 59/64]
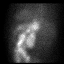

[6 of 6 positions shown; findings below may reference images not displayed]

FINDINGS: Myocardial perfusion imaging

Baseline EKG showed normal sinus rhythm with inferior Q-waves. After
injection heart rate increased from 70 beats per min up to 105 beats
per min and blood pressure decreased from 143/87 down to 126/80. The
test was stopped after injection was complete, the patient did not
experience any chest pain.

Post-injection EKG showed no ischemic changes, occasional PVCs.

Myocardial perfusion imaging

Raw images showed appropriate radiotracer uptake. There was a
moderate-sized moderately reversible inferior wall defect. Gated
imaging showed end-diastolic volume 125 mL, and systolic volume 75
mL, left ventricular ejection fraction 40%, t.i.d. 0.90. The
inferior wall was hypokinetic.
IMPRESSION: 1.  Abnormal Lexiscan MPI for ischemia

2. Moderate sized inferior wall infarct with moderate peri-infarct
ischemia

3.  Decreased left ventricular systolic function, LVEF 40%

4. High risk study for major cardiac events based on low ejection
fraction and moderate area of myocardium at jeopardy.

## 2015-04-29 ENCOUNTER — Other Ambulatory Visit: Payer: Self-pay | Admitting: Cardiovascular Disease

## 2015-04-29 NOTE — Telephone Encounter (Signed)
Not certain he needs simvastatin, Zetia, and gemfibrozil. Could consider taking simvastatin 40 mg and Zetia without gemfibrozil and repeating lipids in 2 months.  Otherwise, he is only on simvastatin 10 mg which should be fine.

## 2015-05-01 MED ORDER — SIMVASTATIN 40 MG PO TABS: 40.0000 mg | ORAL_TABLET | Freq: Every day | ORAL | Status: DC

## 2015-05-01 NOTE — Telephone Encounter (Signed)
Patient notified.  He will stop the Gemfibrozil all together.  He will increase the Simvastatin to 40mg  daily - new 90 day sent to [facility] today.   Will mail reminder for Lipids when time.

## 2015-05-01 NOTE — Telephone Encounter (Signed)
Need to clarify Amlodipine doseage.  Left message to return call.

## 2015-05-02 NOTE — Telephone Encounter (Signed)
Patient is returning call to [place]

## 2015-05-05 IMAGING — CR DG CHEST 1V
1 series · 1 of 1 positions shown · non-contrast
Comparison: None.

CLINICAL DATA: Short of breath.

EXAM:
CHEST - 1 VIEW

[w chest pa]
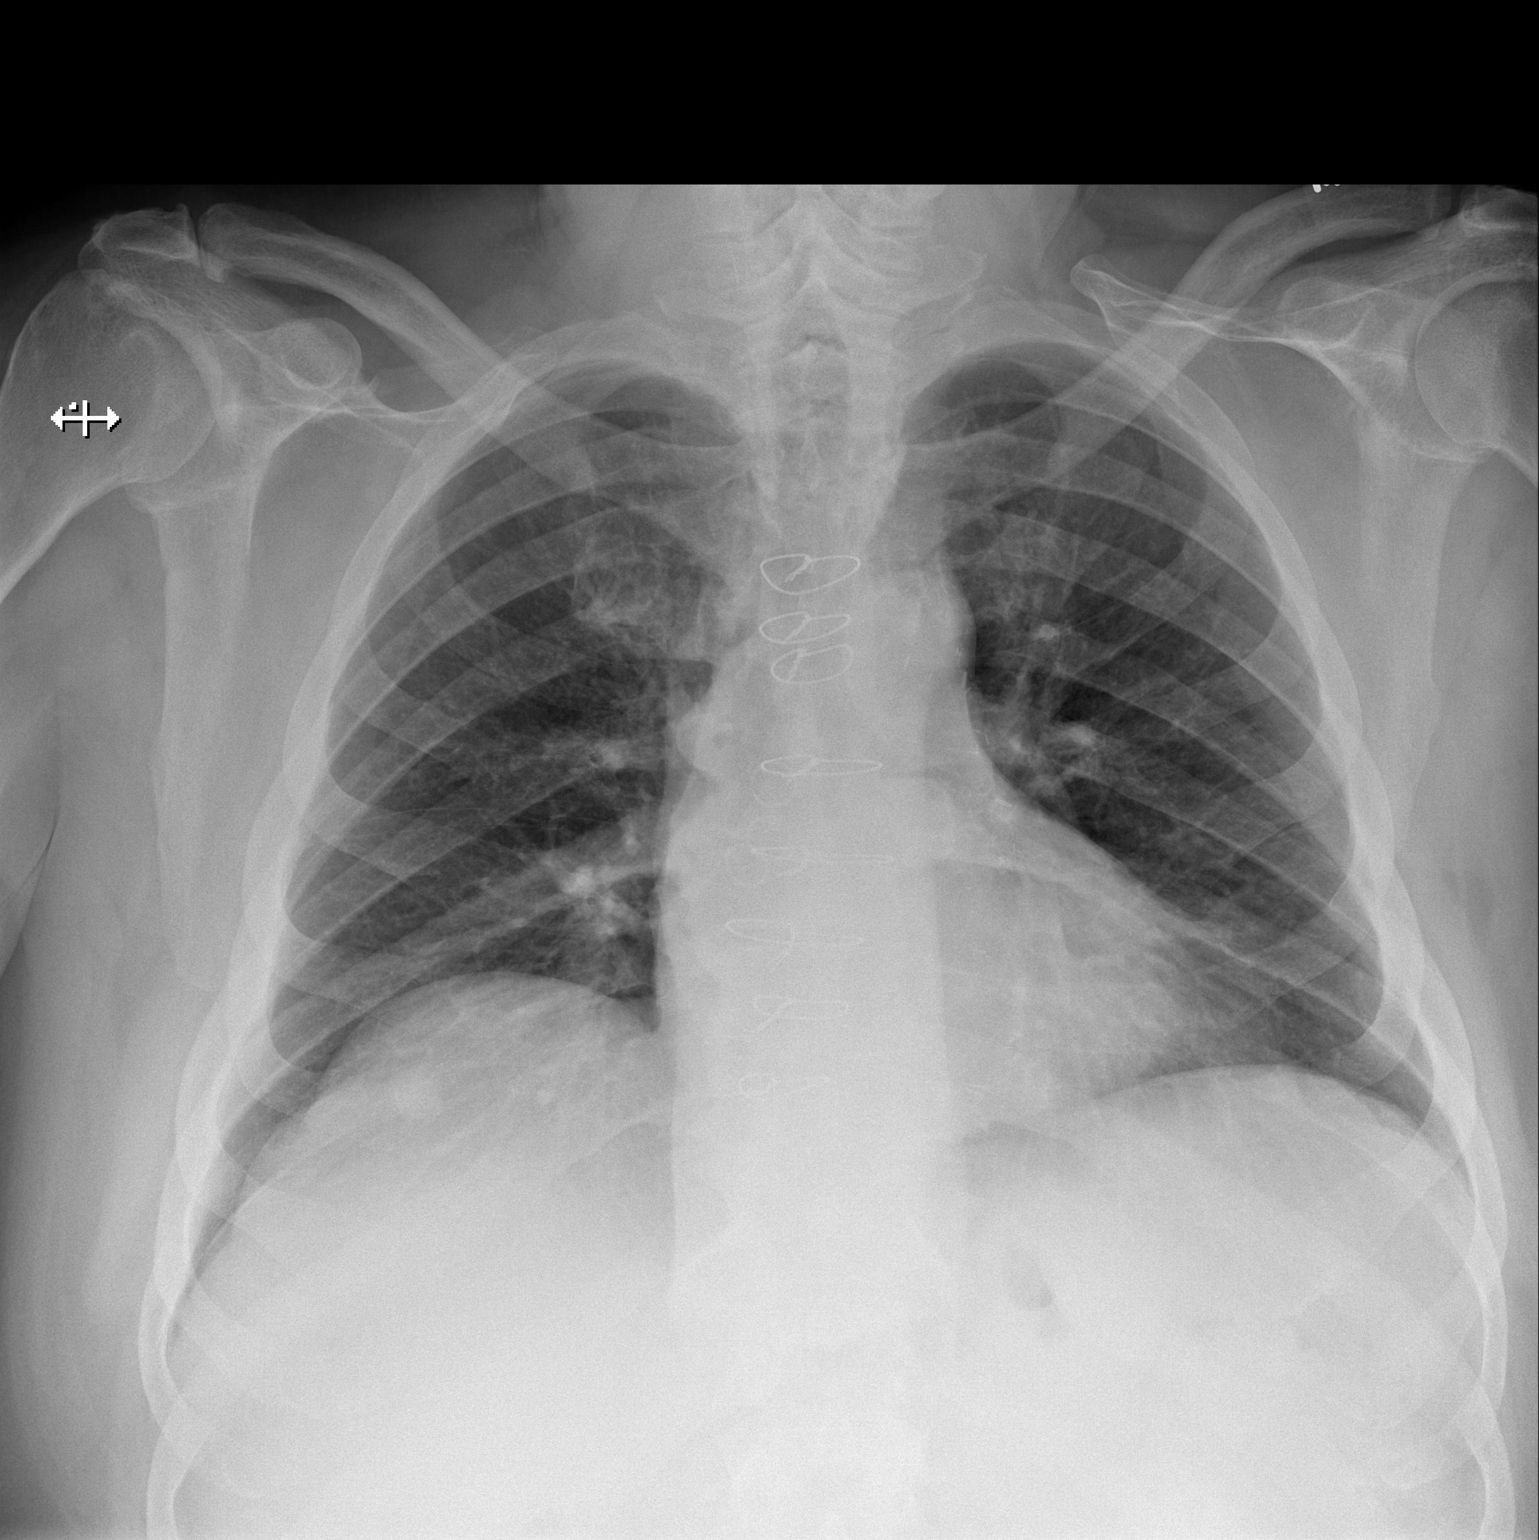

[1 of 1 positions shown; findings below may reference images not displayed]

FINDINGS: Status post CABG surgery. Cardiac silhouette is normal in size and
configuration. Normal mediastinal and hilar contours. Clear lungs.
No pleural effusion. No pneumothorax.

The bony thorax is intact.
IMPRESSION: No active disease.

## 2015-05-07 MED ORDER — SIMVASTATIN 20 MG PO TABS: 20.0000 mg | ORAL_TABLET | Freq: Every day | ORAL | Status: DC

## 2015-05-07 NOTE — Telephone Encounter (Signed)
Call placed to patient.  Amlodipine dose is 10mg  daily.  Will correct on medication list.

## 2015-05-07 NOTE — Telephone Encounter (Signed)
Patient notified.  Will send corrected rx to [facility].

## 2015-05-07 NOTE — Telephone Encounter (Signed)
Simvastatin 20 mg is fine.

## 2015-05-07 NOTE — Telephone Encounter (Signed)
Please confirm if you want patient to continue with the 40mg  Simvastatin & Amlodipine 10mg .  Patient chose to stop the Lopid & go with the Zetia and Simvastatin.  Per [facility] - the problem now is having the Simvastatin at 40mg .  Guide lines stating not to exceed 20mg  on the Simvastatin while on Amlodipine.

## 2015-07-10 ENCOUNTER — Other Ambulatory Visit: Payer: Self-pay | Admitting: *Deleted

## 2015-07-10 ENCOUNTER — Encounter: Payer: Self-pay | Admitting: *Deleted

## 2015-07-10 DIAGNOSIS — E785 Hyperlipidemia, unspecified: Secondary | ICD-10-CM

## 2015-07-19 ENCOUNTER — Telehealth: Payer: Self-pay | Admitting: Cardiovascular Disease

## 2015-07-19 NOTE — Telephone Encounter (Signed)
Will forward to nurse FYI

## 2015-07-19 NOTE — Telephone Encounter (Signed)
Having lab work done at PCP

## 2015-07-24 ENCOUNTER — Encounter: Payer: Self-pay | Admitting: *Deleted

## 2015-07-24 NOTE — Telephone Encounter (Signed)
Will request labs from pmd.

## 2015-07-28 ENCOUNTER — Other Ambulatory Visit: Payer: Self-pay | Admitting: Cardiovascular Disease

## 2015-09-03 ENCOUNTER — Encounter: Payer: Self-pay | Admitting: *Deleted

## 2015-10-15 ENCOUNTER — Other Ambulatory Visit: Payer: Self-pay | Admitting: Cardiovascular Disease

## 2016-04-14 ENCOUNTER — Other Ambulatory Visit: Payer: Self-pay | Admitting: Cardiovascular Disease

## 2016-04-24 ENCOUNTER — Other Ambulatory Visit: Payer: Self-pay | Admitting: Cardiovascular Disease

## 2016-07-07 ENCOUNTER — Other Ambulatory Visit: Payer: Self-pay | Admitting: *Deleted

## 2016-07-07 MED ORDER — RAMIPRIL 10 MG PO CAPS: 10.0000 mg | ORAL_CAPSULE | Freq: Every day | ORAL | 0 refills | Status: DC

## 2016-07-07 MED ORDER — CLOPIDOGREL BISULFATE 75 MG PO TABS: 75.0000 mg | ORAL_TABLET | Freq: Every day | ORAL | 0 refills | Status: DC

## 2016-07-07 MED ORDER — SIMVASTATIN 20 MG PO TABS: 20.0000 mg | ORAL_TABLET | Freq: Every day | ORAL | 0 refills | Status: DC

## 2016-07-07 MED ORDER — AMLODIPINE BESYLATE 10 MG PO TABS: 10.0000 mg | ORAL_TABLET | Freq: Every day | ORAL | 0 refills | Status: DC

## 2016-07-10 ENCOUNTER — Other Ambulatory Visit: Payer: Self-pay | Admitting: *Deleted

## 2016-07-10 MED ORDER — CLOPIDOGREL BISULFATE 75 MG PO TABS: 75.0000 mg | ORAL_TABLET | Freq: Every day | ORAL | 0 refills | Status: DC

## 2016-07-10 MED ORDER — AMLODIPINE BESYLATE 10 MG PO TABS: 10.0000 mg | ORAL_TABLET | Freq: Every day | ORAL | 0 refills | Status: DC

## 2016-07-10 MED ORDER — SIMVASTATIN 20 MG PO TABS: 20.0000 mg | ORAL_TABLET | Freq: Every day | ORAL | 0 refills | Status: DC

## 2016-07-10 MED ORDER — RAMIPRIL 10 MG PO CAPS: 10.0000 mg | ORAL_CAPSULE | Freq: Every day | ORAL | 0 refills | Status: DC

## 2016-07-17 ENCOUNTER — Encounter: Payer: Self-pay | Admitting: Cardiovascular Disease

## 2016-07-17 ENCOUNTER — Ambulatory Visit (INDEPENDENT_AMBULATORY_CARE_PROVIDER_SITE_OTHER): Payer: BLUE CROSS/BLUE SHIELD | Admitting: Cardiovascular Disease

## 2016-07-17 VITALS — BP 183/82 | HR 69 | Ht 72.0 in | Wt 243.0 lb

## 2016-07-17 DIAGNOSIS — I25708 Atherosclerosis of coronary artery bypass graft(s), unspecified, with other forms of angina pectoris: Secondary | ICD-10-CM

## 2016-07-17 DIAGNOSIS — I10 Essential (primary) hypertension: Secondary | ICD-10-CM

## 2016-07-17 DIAGNOSIS — E1169 Type 2 diabetes mellitus with other specified complication: Secondary | ICD-10-CM

## 2016-07-17 DIAGNOSIS — E785 Hyperlipidemia, unspecified: Secondary | ICD-10-CM | POA: Diagnosis not present

## 2016-07-17 MED ORDER — SIMVASTATIN 40 MG PO TABS: 40.0000 mg | ORAL_TABLET | Freq: Every day | ORAL | 3 refills | Status: DC

## 2016-07-17 NOTE — Patient Instructions (Signed)
Your physician wants you to follow-up in: 1 year with Dr. Bronson Ing. You will receive a reminder letter in the mail two months in advance. If you don't receive a letter, please call our office to schedule the follow-up appointment.  Your physician has recommended you make the following change in your medication:   INCREASE SIMVASTATIN TO 40MG  DAILY.    Continue all other medications as directed.

## 2016-07-17 NOTE — Progress Notes (Signed)
SUBJECTIVE: The patient presents for routine follow up. In April 2015, he underwent drug-eluting stent placement to the midportion of the saphenous vein graft to the obtuse marginal as well as drug-eluting stent placement to the native proximal left circumflex coronary artery.  Echocardiography demonstrated mildly reduced LV systolic function, EF [id number], with inferolateral akinesis and grade I diastolic dysfunction. He is feeling well and denies chest pain, SOB, leg swelling, dizziness, and syncope.  ECG performed in the office today shows normal sinus rhythm with possible old inferior infarct and nonspecific intraventricular conduction delay.  A review of labs performed 05/12/16 demonstrated BUN 15, creatinine 0.99, total cholesterol 187, trig glycerides 246, HDL 34, LDL 104, HbA1c 8.1%.  He just received medication prescriptions and has not filled his antihypertensive medications yet.   Review of Systems: As per "subjective", otherwise negative.  No Known Allergies  Current Outpatient Prescriptions  Medication Sig Dispense Refill  . amLODipine (NORVASC) 10 MG tablet Take 1 tablet (10 mg total) by mouth daily. 90 tablet 0  . aspirin EC 81 MG tablet Take 81 mg by mouth daily.    . clopidogrel (PLAVIX) 75 MG tablet Take 1 tablet (75 mg total) by mouth daily with breakfast. 90 tablet 0  . dapagliflozin propanediol (FARXIGA) 10 MG TABS tablet Take 10 mg by mouth daily.    . metFORMIN (GLUCOPHAGE-XR) 500 MG 24 hr tablet Take 1 tablet (500 mg total) by mouth 2 (two) times daily. Take 3 tablets in AM and 2 tablets in PM (Patient taking differently: Take 3 tablets in AM and 2 tablets in PM) 30 tablet 11  . metoprolol (LOPRESSOR) 50 MG tablet Take 25 mg by mouth 2 (two) times daily.    . nitroGLYCERIN (NITROSTAT) 0.4 MG SL tablet Place 1 tablet (0.4 mg total) under the tongue every 5 (five) minutes as needed for chest pain. 25 tablet 3  . Omega-3 Fatty Acids (FISH OIL) 1200 MG CAPS Take  1,400 mg by mouth daily.     . ramipril (ALTACE) 10 MG capsule Take 1 capsule (10 mg total) by mouth daily. 90 capsule 0  . simvastatin (ZOCOR) 20 MG tablet Take 1 tablet (20 mg total) by mouth daily. 90 tablet 0   No current facility-administered medications for this visit.     Past Medical History:  Diagnosis Date  . CAD (coronary artery disease)    H/O MI AND CABG  . Diabetes mellitus ([place])   . Hyperlipidemia   . Hypertension     Past Surgical History:  Procedure Laterality Date  . CORONARY ARTERY BYPASS GRAFT    . LEFT HEART CATHETERIZATION WITH CORONARY/GRAFT ANGIOGRAM N/A 10/19/2013   Procedure: LEFT HEART CATHETERIZATION WITH [name];  Surgeon: [name], MD;  Location: [facility] CATH LAB;  Service: Cardiovascular;  Laterality: N/A;  . PERCUTANEOUS CORONARY STENT INTERVENTION (PCI-S)  10/19/2013   Procedure: PERCUTANEOUS CORONARY STENT INTERVENTION (PCI-S);  Surgeon: [name], MD;  Location: [facility] CATH LAB;  Service: Cardiovascular;;    Social History   Social History  . Marital status: Married    Spouse name: N/A  . Number of children: N/A  . Years of education: N/A   Occupational History  . Not on file.   Social History Main Topics  . Smoking status: Former Smoker    Packs/day: 1.00    Years: 44.00    Types: Cigarettes    Start date: 06/22/1961    Quit date: 06/22/2000  . Smokeless tobacco: Never  Used  . Alcohol use Not on file  . Drug use: Unknown  . Sexual activity: Not on file   Other Topics Concern  . Not on file   Social History Narrative  . No narrative on file     Vitals:   07/17/16 1328  BP: (!) 183/82  Pulse: 69  Weight: 243 lb (110.2 kg)  Height: 6' (1.829 m)    PHYSICAL EXAM General: NAD HEENT: Normal. Neck: No JVD, no thyromegaly. Lungs: Clear to auscultation bilaterally with normal respiratory effort. CV: Nondisplaced PMI.  Regular rate and rhythm, normal S1/S2, no S3/S4, no murmur. No pretibial or  periankle edema.  No carotid bruit.   Abdomen: Obese.  Neurologic: Alert and oriented.  Psych: Normal affect. Skin: Normal. Musculoskeletal: No gross deformities.    ECG: Most recent ECG reviewed.      ASSESSMENT AND PLAN:  1. CAD/CABG s/p recent DES x 2 to mid SVG to OM and proximal circumflex: Symptomatically stable. Continue ASA, Plavix, metoprolol, and statin.  2. Essential hypertension: Markedly elevated. However, he just received medication prescriptions and has not filled his antihypertensive medications yet.  3. Dyslipidemia: Lipids reviewed above. LDL elevated (104). Increase simvastatin to 40 mg.  Dispo: f/u 1 year.  Kate Sable, M.D., F.A.C.C.

## 2016-07-23 ENCOUNTER — Other Ambulatory Visit: Payer: Self-pay | Admitting: Cardiovascular Disease

## 2016-10-01 ENCOUNTER — Other Ambulatory Visit: Payer: Self-pay | Admitting: Cardiovascular Disease

## 2017-01-20 ENCOUNTER — Other Ambulatory Visit: Payer: Self-pay | Admitting: Cardiovascular Disease

## 2017-05-07 ENCOUNTER — Other Ambulatory Visit: Payer: Self-pay | Admitting: Cardiovascular Disease

## 2017-06-14 ENCOUNTER — Encounter: Payer: Self-pay | Admitting: Cardiovascular Disease

## 2017-06-14 ENCOUNTER — Ambulatory Visit (INDEPENDENT_AMBULATORY_CARE_PROVIDER_SITE_OTHER): Payer: BLUE CROSS/BLUE SHIELD | Admitting: Cardiovascular Disease

## 2017-06-14 VITALS — BP 158/82 | HR 68 | Ht 72.0 in | Wt 255.0 lb

## 2017-06-14 DIAGNOSIS — I25708 Atherosclerosis of coronary artery bypass graft(s), unspecified, with other forms of angina pectoris: Secondary | ICD-10-CM | POA: Diagnosis not present

## 2017-06-14 DIAGNOSIS — I10 Essential (primary) hypertension: Secondary | ICD-10-CM | POA: Diagnosis not present

## 2017-06-14 DIAGNOSIS — E785 Hyperlipidemia, unspecified: Secondary | ICD-10-CM | POA: Diagnosis not present

## 2017-06-14 DIAGNOSIS — E1169 Type 2 diabetes mellitus with other specified complication: Secondary | ICD-10-CM | POA: Diagnosis not present

## 2017-06-14 MED ORDER — RAMIPRIL 10 MG PO CAPS
10.0000 mg | ORAL_CAPSULE | Freq: Two times a day (BID) | ORAL | 3 refills | Status: DC
Start: 2017-06-14 — End: 2022-08-07

## 2017-06-14 NOTE — Patient Instructions (Signed)
Medication Instructions:   Increase Ramipril to 10mg  twice a day.  Continue all other medications.    Labwork: none  Testing/Procedures: none  Follow-Up: Your physician wants you to follow up in:  1 year.  You will receive a reminder letter in the mail one-two months in advance.  If you don't receive a letter, please call our office to schedule the follow up appointment   Any Other Special Instructions Will Be Listed Below (If Applicable).  If you need a refill on your cardiac medications before your next appointment, please call your pharmacy.

## 2017-06-14 NOTE — Progress Notes (Signed)
SUBJECTIVE: The patient presents for routine follow up. In April 2015, he underwent drug-eluting stent placement to the midportion of the saphenous vein graft to the obtuse marginal as well as drug-eluting stent placement to the native proximal left circumflex coronary artery.  Echocardiography demonstrated mildly reduced LV systolic function, EF 33-82%, with inferolateral akinesis and grade I diastolic dysfunction.  ECG performed today which I personally interpreted demonstrated sinus rhythm with old inferior infarct.  He takes care of his great grandchildren who live with him.  One is 69 years old and one is 69 years old.  He used to hunt and fish and get his exercise that way but he is no longer able to do so.  He brought in his blood tests for me to review.  He had been off of simvastatin for a couple of weeks because he developed some joint pain in both his hands and his PCP tried taking him off of simvastatin to see if this would help to alleviate his symptoms.  It did not.  His cholesterol was checked on 05/03/17 and levels were markedly elevated, total cholesterol 226, triglycerides 195, HDL 36, LDL 151.  BUN was 19 and creatinine was 1.13.  He denies chest pain, orthopnea, shortness of breath, and palpitations.  He has a treadmill but he does not use it.    Review of Systems: As per "subjective", otherwise negative.  No Known Allergies  Current Outpatient Medications  Medication Sig Dispense Refill  . amLODipine (NORVASC) 10 MG tablet TAKE 1 TABLET DAILY. MAKE  AN APPOINTMENT FOR FURTHER REFILLS 90 tablet 0  . aspirin EC 81 MG tablet Take 81 mg by mouth daily.    . clopidogrel (PLAVIX) 75 MG tablet TAKE 1 TABLET DAILY WITH   BREAKFAST 90 tablet 0  . dapagliflozin propanediol (FARXIGA) 10 MG TABS tablet Take 10 mg by mouth daily.    . metFORMIN (GLUCOPHAGE-XR) 500 MG 24 hr tablet Take 1 tablet (500 mg total) by mouth 2 (two) times daily. Take 3 tablets in AM and 2 tablets in  PM (Patient taking differently: Take 3 tablets in AM and 2 tablets in PM) 30 tablet 11  . metoprolol (LOPRESSOR) 50 MG tablet Take 25 mg by mouth 2 (two) times daily.    . nitroGLYCERIN (NITROSTAT) 0.4 MG SL tablet Place 1 tablet (0.4 mg total) under the tongue every 5 (five) minutes as needed for chest pain. 25 tablet 3  . Omega-3 Fatty Acids (FISH OIL) 1200 MG CAPS Take 1,400 mg by mouth daily.     . ramipril (ALTACE) 10 MG capsule Take 1 capsule (10 mg total) by mouth daily. 90 capsule 0  . simvastatin (ZOCOR) 40 MG tablet Take 1 tablet (40 mg total) by mouth at bedtime. 90 tablet 3   No current facility-administered medications for this visit.     Past Medical History:  Diagnosis Date  . CAD (coronary artery disease)    H/O MI AND CABG  . Diabetes mellitus ([place])   . Hyperlipidemia   . Hypertension     Past Surgical History:  Procedure Laterality Date  . CORONARY ARTERY BYPASS GRAFT    . LEFT HEART CATHETERIZATION WITH CORONARY/GRAFT ANGIOGRAM N/A 10/19/2013   Procedure: LEFT HEART CATHETERIZATION WITH [name];  Surgeon: [name], MD;  Location: [facility] CATH LAB;  Service: Cardiovascular;  Laterality: N/A;  . PERCUTANEOUS CORONARY STENT INTERVENTION (PCI-S)  10/19/2013   Procedure: PERCUTANEOUS CORONARY STENT INTERVENTION (PCI-S);  Surgeon: Harrell Gave  Santina Evans, MD;  Location: [facility] CATH LAB;  Service: Cardiovascular;;    Social History   Socioeconomic History  . Marital status: Married    Spouse name: Not on file  . Number of children: Not on file  . Years of education: Not on file  . Highest education level: Not on file  Social Needs  . Financial resource strain: Not on file  . Food insecurity - worry: Not on file  . Food insecurity - inability: Not on file  . Transportation needs - medical: Not on file  . Transportation needs - non-medical: Not on file  Occupational History  . Not on file  Tobacco Use  . Smoking status: Former Smoker     Packs/day: 1.00    Years: 44.00    Pack years: 44.00    Types: Cigarettes    Start date: 06/22/1961    Last attempt to quit: 06/22/2000    Years since quitting: 16.9  . Smokeless tobacco: Never Used  Substance and Sexual Activity  . Alcohol use: Not on file  . Drug use: Not on file  . Sexual activity: Not on file  Other Topics Concern  . Not on file  Social History Narrative  . Not on file     Vitals:   06/14/17 1003  BP: (!) 158/82  Pulse: 68  SpO2: 97%  Weight: 255 lb (115.7 kg)  Height: 6' (1.829 m)    Wt Readings from Last 3 Encounters:  06/14/17 255 lb (115.7 kg)  07/17/16 243 lb (110.2 kg)  04/01/15 245 lb (111.1 kg)     PHYSICAL EXAM General: NAD HEENT: Normal. Neck: No JVD, no thyromegaly. Lungs: Clear to auscultation bilaterally with normal respiratory effort. CV: Regular rate and rhythm, normal S1/S2, no S3/S4, no murmur. No pretibial or periankle edema.  No carotid bruit.   Abdomen: Soft, nontender, no distention.  Neurologic: Alert and oriented.  Psych: Normal affect. Skin: Normal. Musculoskeletal: No gross deformities.    ECG: Most recent ECG reviewed.   Labs: Lab Results  Component Value Date/Time   K 4.1 10/20/2013 05:55 AM   BUN 16 10/20/2013 05:55 AM   CREATININE 1.00 10/20/2013 05:55 AM   HGB 13.4 10/20/2013 05:55 AM     Lipids: No results found for: LDLCALC, LDLDIRECT, CHOL, TRIG, HDL     ASSESSMENT AND PLAN:  1. CAD/CABG s/p DES x 2 to mid SVG to OM and proximal circumflex: Symptomatically stable. Continue ASA, Plavix, metoprolol, and statin.  I encouraged him to start using his treadmill in 10-15-minute increments 2 or 3 times per day most days of the week.  2. Essential hypertension: Markedly elevated.  I will increase ramipril to 10 mg twice daily.  3. Dyslipidemia:  Lipids reviewed above.  Markedly elevated while off of simvastatin.  He restarted it..  Continue simvastatin 40 mg.  Lipids will need to be checked again within  the next few months.     Disposition: Follow up 1 year.   Kate Sable, M.D., F.A.C.C.

## 2017-07-19 ENCOUNTER — Ambulatory Visit: Payer: BLUE CROSS/BLUE SHIELD | Admitting: Cardiovascular Disease

## 2017-08-20 ENCOUNTER — Other Ambulatory Visit: Payer: Self-pay | Admitting: Cardiovascular Disease

## 2017-09-22 ENCOUNTER — Other Ambulatory Visit: Payer: Self-pay | Admitting: Cardiovascular Disease

## 2018-05-04 DIAGNOSIS — C44311 Basal cell carcinoma of skin of nose: Secondary | ICD-10-CM | POA: Diagnosis not present

## 2018-06-21 ENCOUNTER — Ambulatory Visit (INDEPENDENT_AMBULATORY_CARE_PROVIDER_SITE_OTHER): Payer: Medicare Other | Admitting: Cardiovascular Disease

## 2018-06-21 ENCOUNTER — Encounter: Payer: Self-pay | Admitting: Cardiovascular Disease

## 2018-06-21 ENCOUNTER — Telehealth: Payer: Self-pay | Admitting: Cardiovascular Disease

## 2018-06-21 VITALS — BP 180/90 | HR 94 | Ht 72.0 in | Wt 267.0 lb

## 2018-06-21 DIAGNOSIS — I10 Essential (primary) hypertension: Secondary | ICD-10-CM

## 2018-06-21 DIAGNOSIS — E1169 Type 2 diabetes mellitus with other specified complication: Secondary | ICD-10-CM

## 2018-06-21 DIAGNOSIS — E785 Hyperlipidemia, unspecified: Secondary | ICD-10-CM

## 2018-06-21 DIAGNOSIS — I25708 Atherosclerosis of coronary artery bypass graft(s), unspecified, with other forms of angina pectoris: Secondary | ICD-10-CM

## 2018-06-21 DIAGNOSIS — I255 Ischemic cardiomyopathy: Secondary | ICD-10-CM | POA: Diagnosis not present

## 2018-06-21 DIAGNOSIS — I5022 Chronic systolic (congestive) heart failure: Secondary | ICD-10-CM

## 2018-06-21 NOTE — Progress Notes (Signed)
SUBJECTIVE: The patient presents for routine follow up. In April 2015, he underwent drug-eluting stent placement to the midportion of the saphenous vein graft to the obtuse marginal as well as drug-eluting stent placement to the native proximal left circumflex coronary artery.  Echocardiography demonstrated mildly reduced LV systolic function, EF 40-34%, with inferolateral akinesis and grade I diastolic dysfunction.  The patient denies any symptoms of chest pain, palpitations, shortness of breath, lightheadedness, dizziness, leg swelling, orthopnea, PND, and syncope.  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  He said he has been under a lot of stress over the past 2 to 3 months and became tearful but did not go into detail.  I reviewed lipids dated 05/06/2018: LDL 40, total cholesterol 108, HDL 36, triglycerides 159, A1c 8.1%.  He said his blood pressure is usually normal when checked at his PCPs office.    Review of Systems: As per "subjective", otherwise negative.  No Known Allergies  Current Outpatient Medications  Medication Sig Dispense Refill  . amLODipine (NORVASC) 10 MG tablet TAKE 1 TABLET DAILY. MAKE  AN APPOINTMENT FOR FURTHER REFILLS 90 tablet 2  . aspirin EC 81 MG tablet Take 81 mg by mouth daily.    . clopidogrel (PLAVIX) 75 MG tablet TAKE 1 TABLET DAILY WITH   BREAKFAST 90 tablet 2  . metFORMIN (GLUCOPHAGE-XR) 500 MG 24 hr tablet Take 1 tablet (500 mg total) by mouth 2 (two) times daily. Take 3 tablets in AM and 2 tablets in PM (Patient taking differently: Take 3 tablets in AM and 2 tablets in PM) 30 tablet 11  . metoprolol (LOPRESSOR) 50 MG tablet Take 25 mg by mouth 2 (two) times daily.    . nitroGLYCERIN (NITROSTAT) 0.4 MG SL tablet Place 1 tablet (0.4 mg total)  under the tongue every 5 (five) minutes as needed for chest pain. 25 tablet 3  . Omega-3 Fatty Acids (FISH OIL) 1200 MG CAPS Take 1,400 mg by mouth daily.     . pioglitazone (ACTOS) 15 MG tablet Take 15 mg by mouth daily.    . ramipril (ALTACE) 10 MG capsule Take 1 capsule (10 mg total) by mouth 2 (two) times daily. 180 capsule 3  . simvastatin (ZOCOR) 40 MG tablet TAKE 1 TABLET BY MOUTH EVERY DAY AT BEDTIME 90 tablet 3   No current facility-administered medications for this visit.     Past Medical History:  Diagnosis Date  . CAD (coronary artery disease)    H/O MI AND CABG  . Diabetes mellitus ([place])   . Hyperlipidemia   . Hypertension     Past Surgical History:  Procedure Laterality Date  . CORONARY ARTERY BYPASS GRAFT    . LEFT HEART CATHETERIZATION WITH CORONARY/GRAFT ANGIOGRAM N/A 10/19/2013   Procedure: LEFT HEART CATHETERIZATION WITH [name];  Surgeon: [name], MD;  Location: [facility] CATH LAB;  Service: Cardiovascular;  Laterality: N/A;  . PERCUTANEOUS CORONARY STENT INTERVENTION (PCI-S)  10/19/2013   Procedure: PERCUTANEOUS CORONARY STENT INTERVENTION (PCI-S);  Surgeon: [name], MD;  Location: [facility] CATH LAB;  Service: Cardiovascular;;    Social History   Socioeconomic History  . Marital status: Married    Spouse name: Not on file  . Number of children: Not on file  . Years of education: Not on file  . Highest  education level: Not on file  Occupational History  . Not on file  Social Needs  . Financial resource strain: Not on file  . Food insecurity:    Worry: Not on file    Inability: Not on file  . Transportation needs:    Medical: Not on file    Non-medical: Not on file  Tobacco Use  . Smoking status: Former Smoker    Packs/day: 1.00    Years: 44.00    Pack years: 44.00    Types: Cigarettes    Start date: 06/22/1961    Last attempt to quit: 06/22/2000    Years since quitting: 18.0  . Smokeless tobacco: Never Used    Substance and Sexual Activity  . Alcohol use: Not on file  . Drug use: Not on file  . Sexual activity: Not on file  Lifestyle  . Physical activity:    Days per week: Not on file    Minutes per session: Not on file  . Stress: Not on file  Relationships  . Social connections:    Talks on phone: Not on file    Gets together: Not on file    Attends religious service: Not on file    Active member of club or organization: Not on file    Attends meetings of clubs or organizations: Not on file    Relationship status: Not on file  . Intimate partner violence:    Fear of current or ex partner: Not on file    Emotionally abused: Not on file    Physically abused: Not on file    Forced sexual activity: Not on file  Other Topics Concern  . Not on file  Social History Narrative  . Not on file     Vitals:   06/21/18 1403  BP: (!) 180/90  Pulse: 94  SpO2: 96%  Weight: 267 lb (121.1 kg)  Height: 6' (1.829 m)    Wt Readings from Last 3 Encounters:  06/21/18 267 lb (121.1 kg)  06/14/17 255 lb (115.7 kg)  07/17/16 243 lb (110.2 kg)     PHYSICAL EXAM General: NAD HEENT: Normal. Neck: No JVD, no thyromegaly. Lungs: Clear to auscultation bilaterally with normal respiratory effort. CV: Regular rate and rhythm, normal S1/S2, no S3/S4, no murmur. No pretibial or periankle edema.  No carotid bruit.   Abdomen: Soft, nontender, no distention.  Neurologic: Alert and oriented.  Psych: Normal affect. Skin: Normal. Musculoskeletal: No gross deformities.    ECG: Reviewed above under Subjective   Labs: Lab Results  Component Value Date/Time   K 4.1 10/20/2013 05:55 AM   BUN 16 10/20/2013 05:55 AM   CREATININE 1.00 10/20/2013 05:55 AM   HGB 13.4 10/20/2013 05:55 AM     Lipids: No results found for: LDLCALC, LDLDIRECT, CHOL, TRIG, HDL     ASSESSMENT AND PLAN:  1. CAD/CABG s/p DES x 2 to mid SVG to OM and proximal circumflex: Symptomatically stable. Continue ASA, Plavix,  metoprolol,and statin.   2. Essential hypertension:Markedly elevated in our office today but he attributes this to stress today and coming for this visit.  He said it is checked at his PCPs office every 4 months and it is usually well controlled.  No changes today but this will need further monitoring.  3. Dyslipidemia:  Currently on simvastatin 40 mg.  As reviewed above and at goal.  No changes to therapy.  4.  Cardiomyopathy/chronic systolic heart failure: LVEF 40 to 45% on 10/23/2013.  I will obtain  a follow-up echocardiogram to assess for interval changes in cardiac structure and function.   Disposition: Follow up 1 year   Kate Sable, M.D., F.A.C.C.

## 2018-06-21 NOTE — Telephone Encounter (Signed)
°  Precert needed for: Echo - ischemic cardiomyopathy  Location: [facility]     Date   Jun 29, 2018

## 2018-06-21 NOTE — Patient Instructions (Signed)

## 2018-06-29 ENCOUNTER — Telehealth: Payer: Self-pay | Admitting: Cardiovascular Disease

## 2018-06-29 ENCOUNTER — Other Ambulatory Visit: Payer: Self-pay

## 2018-06-29 ENCOUNTER — Ambulatory Visit (INDEPENDENT_AMBULATORY_CARE_PROVIDER_SITE_OTHER): Payer: Medicare Other

## 2018-06-29 DIAGNOSIS — I255 Ischemic cardiomyopathy: Secondary | ICD-10-CM | POA: Diagnosis not present

## 2018-06-29 NOTE — Telephone Encounter (Signed)
[facility] is prescription information

## 2018-07-04 ENCOUNTER — Telehealth: Payer: Self-pay | Admitting: *Deleted

## 2018-07-04 NOTE — Telephone Encounter (Signed)
Notes recorded by Laurine Blazer, LPN on 1/93/7902 at 40:97 AM EST Patient notified. Copy to pmd. ------  Notes recorded by Herminio Commons, MD on 06/30/2018 at 4:51 PM EST Normal pumping function.

## 2018-07-05 DIAGNOSIS — L578 Other skin changes due to chronic exposure to nonionizing radiation: Secondary | ICD-10-CM | POA: Diagnosis not present

## 2018-07-05 DIAGNOSIS — L905 Scar conditions and fibrosis of skin: Secondary | ICD-10-CM | POA: Diagnosis not present

## 2018-07-05 DIAGNOSIS — L570 Actinic keratosis: Secondary | ICD-10-CM | POA: Diagnosis not present

## 2018-07-05 DIAGNOSIS — Z85828 Personal history of other malignant neoplasm of skin: Secondary | ICD-10-CM | POA: Diagnosis not present

## 2018-07-05 DIAGNOSIS — L219 Seborrheic dermatitis, unspecified: Secondary | ICD-10-CM | POA: Diagnosis not present

## 2018-07-05 DIAGNOSIS — L820 Inflamed seborrheic keratosis: Secondary | ICD-10-CM | POA: Diagnosis not present

## 2018-07-05 DIAGNOSIS — L821 Other seborrheic keratosis: Secondary | ICD-10-CM | POA: Diagnosis not present

## 2018-08-04 DIAGNOSIS — L578 Other skin changes due to chronic exposure to nonionizing radiation: Secondary | ICD-10-CM | POA: Diagnosis not present

## 2018-08-04 DIAGNOSIS — L219 Seborrheic dermatitis, unspecified: Secondary | ICD-10-CM | POA: Diagnosis not present

## 2018-08-04 DIAGNOSIS — L905 Scar conditions and fibrosis of skin: Secondary | ICD-10-CM | POA: Diagnosis not present

## 2018-08-04 DIAGNOSIS — Z85828 Personal history of other malignant neoplasm of skin: Secondary | ICD-10-CM | POA: Diagnosis not present

## 2018-08-04 DIAGNOSIS — L821 Other seborrheic keratosis: Secondary | ICD-10-CM | POA: Diagnosis not present

## 2018-08-04 DIAGNOSIS — L570 Actinic keratosis: Secondary | ICD-10-CM | POA: Diagnosis not present

## 2018-08-04 DIAGNOSIS — D225 Melanocytic nevi of trunk: Secondary | ICD-10-CM | POA: Diagnosis not present

## 2018-08-26 DIAGNOSIS — I10 Essential (primary) hypertension: Secondary | ICD-10-CM | POA: Diagnosis not present

## 2018-08-26 DIAGNOSIS — R5382 Chronic fatigue, unspecified: Secondary | ICD-10-CM | POA: Diagnosis not present

## 2018-08-26 DIAGNOSIS — E782 Mixed hyperlipidemia: Secondary | ICD-10-CM | POA: Diagnosis not present

## 2018-08-26 DIAGNOSIS — E1165 Type 2 diabetes mellitus with hyperglycemia: Secondary | ICD-10-CM | POA: Diagnosis not present

## 2018-08-31 DIAGNOSIS — I10 Essential (primary) hypertension: Secondary | ICD-10-CM | POA: Diagnosis not present

## 2018-08-31 DIAGNOSIS — E1165 Type 2 diabetes mellitus with hyperglycemia: Secondary | ICD-10-CM | POA: Diagnosis not present

## 2018-08-31 DIAGNOSIS — E1142 Type 2 diabetes mellitus with diabetic polyneuropathy: Secondary | ICD-10-CM | POA: Diagnosis not present

## 2018-08-31 DIAGNOSIS — E0859 Diabetes mellitus due to underlying condition with other circulatory complications: Secondary | ICD-10-CM | POA: Diagnosis not present

## 2018-08-31 DIAGNOSIS — Z6836 Body mass index (BMI) 36.0-36.9, adult: Secondary | ICD-10-CM | POA: Diagnosis not present

## 2018-08-31 DIAGNOSIS — E1129 Type 2 diabetes mellitus with other diabetic kidney complication: Secondary | ICD-10-CM | POA: Diagnosis not present

## 2018-08-31 DIAGNOSIS — I2589 Other forms of chronic ischemic heart disease: Secondary | ICD-10-CM | POA: Diagnosis not present

## 2018-08-31 DIAGNOSIS — E782 Mixed hyperlipidemia: Secondary | ICD-10-CM | POA: Diagnosis not present

## 2018-12-28 DIAGNOSIS — Z6837 Body mass index (BMI) 37.0-37.9, adult: Secondary | ICD-10-CM | POA: Diagnosis not present

## 2018-12-28 DIAGNOSIS — I2589 Other forms of chronic ischemic heart disease: Secondary | ICD-10-CM | POA: Diagnosis not present

## 2018-12-28 DIAGNOSIS — I10 Essential (primary) hypertension: Secondary | ICD-10-CM | POA: Diagnosis not present

## 2018-12-28 DIAGNOSIS — E782 Mixed hyperlipidemia: Secondary | ICD-10-CM | POA: Diagnosis not present

## 2018-12-28 DIAGNOSIS — E0859 Diabetes mellitus due to underlying condition with other circulatory complications: Secondary | ICD-10-CM | POA: Diagnosis not present

## 2018-12-28 DIAGNOSIS — E1142 Type 2 diabetes mellitus with diabetic polyneuropathy: Secondary | ICD-10-CM | POA: Diagnosis not present

## 2018-12-28 DIAGNOSIS — E1165 Type 2 diabetes mellitus with hyperglycemia: Secondary | ICD-10-CM | POA: Diagnosis not present

## 2018-12-28 DIAGNOSIS — E1129 Type 2 diabetes mellitus with other diabetic kidney complication: Secondary | ICD-10-CM | POA: Diagnosis not present

## 2019-01-31 DIAGNOSIS — Z85828 Personal history of other malignant neoplasm of skin: Secondary | ICD-10-CM | POA: Diagnosis not present

## 2019-01-31 DIAGNOSIS — L578 Other skin changes due to chronic exposure to nonionizing radiation: Secondary | ICD-10-CM | POA: Diagnosis not present

## 2019-01-31 DIAGNOSIS — L570 Actinic keratosis: Secondary | ICD-10-CM | POA: Diagnosis not present

## 2019-01-31 DIAGNOSIS — L259 Unspecified contact dermatitis, unspecified cause: Secondary | ICD-10-CM | POA: Diagnosis not present

## 2019-01-31 DIAGNOSIS — L905 Scar conditions and fibrosis of skin: Secondary | ICD-10-CM | POA: Diagnosis not present

## 2019-01-31 DIAGNOSIS — L821 Other seborrheic keratosis: Secondary | ICD-10-CM | POA: Diagnosis not present

## 2019-04-25 DIAGNOSIS — E1165 Type 2 diabetes mellitus with hyperglycemia: Secondary | ICD-10-CM | POA: Diagnosis not present

## 2019-04-25 DIAGNOSIS — I10 Essential (primary) hypertension: Secondary | ICD-10-CM | POA: Diagnosis not present

## 2019-04-25 DIAGNOSIS — R5382 Chronic fatigue, unspecified: Secondary | ICD-10-CM | POA: Diagnosis not present

## 2019-04-25 DIAGNOSIS — E782 Mixed hyperlipidemia: Secondary | ICD-10-CM | POA: Diagnosis not present

## 2019-04-25 DIAGNOSIS — R5383 Other fatigue: Secondary | ICD-10-CM | POA: Diagnosis not present

## 2019-04-28 DIAGNOSIS — E0859 Diabetes mellitus due to underlying condition with other circulatory complications: Secondary | ICD-10-CM | POA: Diagnosis not present

## 2019-04-28 DIAGNOSIS — Z0001 Encounter for general adult medical examination with abnormal findings: Secondary | ICD-10-CM | POA: Diagnosis not present

## 2019-04-28 DIAGNOSIS — I2589 Other forms of chronic ischemic heart disease: Secondary | ICD-10-CM | POA: Diagnosis not present

## 2019-04-28 DIAGNOSIS — I10 Essential (primary) hypertension: Secondary | ICD-10-CM | POA: Diagnosis not present

## 2019-04-28 DIAGNOSIS — Z6837 Body mass index (BMI) 37.0-37.9, adult: Secondary | ICD-10-CM | POA: Diagnosis not present

## 2019-04-28 DIAGNOSIS — Z23 Encounter for immunization: Secondary | ICD-10-CM | POA: Diagnosis not present

## 2019-04-28 DIAGNOSIS — E1129 Type 2 diabetes mellitus with other diabetic kidney complication: Secondary | ICD-10-CM | POA: Diagnosis not present

## 2019-04-28 DIAGNOSIS — Z1212 Encounter for screening for malignant neoplasm of rectum: Secondary | ICD-10-CM | POA: Diagnosis not present

## 2019-05-22 DIAGNOSIS — E782 Mixed hyperlipidemia: Secondary | ICD-10-CM | POA: Diagnosis not present

## 2019-05-22 DIAGNOSIS — E1165 Type 2 diabetes mellitus with hyperglycemia: Secondary | ICD-10-CM | POA: Diagnosis not present

## 2019-05-31 DIAGNOSIS — H538 Other visual disturbances: Secondary | ICD-10-CM | POA: Diagnosis not present

## 2019-05-31 DIAGNOSIS — E119 Type 2 diabetes mellitus without complications: Secondary | ICD-10-CM | POA: Diagnosis not present

## 2019-06-19 DIAGNOSIS — I10 Essential (primary) hypertension: Secondary | ICD-10-CM | POA: Diagnosis not present

## 2019-06-28 ENCOUNTER — Telehealth: Payer: Self-pay | Admitting: Cardiovascular Disease

## 2019-06-28 NOTE — Telephone Encounter (Signed)
Virtual Visit Pre-Appointment Phone Call  "(Name), I am calling you today to discuss your upcoming appointment. We are currently trying to limit exposure to the virus that causes COVID-19 by seeing patients at home rather than in the office."  "What is the BEST phone number to call the day of the visit?" - [phone]  1. "Do you have or have access to (through a family member/friend) a smartphone with video capability that we can use for your visit?" a. If yes - list this number in appt notes as "cell" (if different from BEST phone #) and list the appointment type as a VIDEO visit in appointment notes b. If no - list the appointment type as a PHONE visit in appointment notes  2. Confirm consent - "In the setting of the current Covid19 crisis, you are scheduled for a (phone or video) visit with your provider on (date) at (time).  Just as we do with many in-office visits, in order for you to participate in this visit, we must obtain consent.  If you'd like, I can send this to your mychart (if signed up) or email for you to review.  Otherwise, I can obtain your verbal consent now.  All virtual visits are billed to your insurance company just like a normal visit would be.  By agreeing to a virtual visit, we'd like you to understand that the technology does not allow for your provider to perform an examination, and thus may limit your provider's ability to fully assess your condition. If your provider identifies any concerns that need to be evaluated in person, we will make arrangements to do so.  Finally, though the technology is pretty good, we cannot assure that it will always work on either your or our end, and in the setting of a video visit, we may have to convert it to a phone-only visit.  In either situation, we cannot ensure that we have a secure connection.  Are you willing to proceed?" STAFF: Did the patient verbally acknowledge consent to telehealth visit? Document YES/NO here: YES    3. Advise patient to be prepared - "Two hours prior to your appointment, go ahead and check your blood pressure, pulse, oxygen saturation, and your weight (if you have the equipment to check those) and write them all down. When your visit starts, your provider will ask you for this information. If you have an Apple Watch or Kardia device, please plan to have heart rate information ready on the day of your appointment. Please have a pen and paper handy nearby the day of the visit as well."  4. Give patient instructions for MyChart download to smartphone OR [email or web address] as below if video visit (depending on what platform provider is using)  5. Inform patient they will receive a phone call 15 minutes prior to their appointment time (may be from unknown caller ID) so they should be prepared to answer    TELEPHONE CALL NOTE  Colin Aguirre has been deemed a candidate for a follow-up tele-health visit to limit community exposure during the Covid-19 pandemic. I spoke with the patient via phone to ensure availability of phone/video source, confirm preferred email & phone number, and discuss instructions and expectations.  I reminded Colin Aguirre to be prepared with any vital sign and/or heart rhythm information that could potentially be obtained via home monitoring, at the time of his visit. I reminded  Colin Aguirre to expect a phone call prior to his visit.  Vicky  T Slaughter 06/28/2019 4:13 PM   INSTRUCTIONS FOR DOWNLOADING THE MYCHART APP TO SMARTPHONE  - The patient must first make sure to have activated MyChart and know their login information - If Apple, go to [employer] and type in MyChart in the search bar and download the app. If Android, ask patient to go to [employer] and type in [place] in the search bar and download the app. The app is free but as with any other app downloads, their phone may require them to verify saved payment information or Apple/Android  password.  - The patient will need to then log into the app with their MyChart username and password, and select [facility] as their healthcare provider to link the account. When it is time for your visit, go to the MyChart app, find appointments, and click Begin Video Visit. Be sure to Select Allow for your device to access the Microphone and Camera for your visit. You will then be connected, and your provider will be with you shortly.  **If they have any issues connecting, or need assistance please contact MyChart service desk [phone])**  **If using a computer, in order to ensure the best quality for their visit they will need to use either of the following Internet Browsers: [employer], or Google Chrome**  IF USING DOXIMITY or DOXY.ME - The patient will receive a link just prior to their visit by text.     FULL LENGTH CONSENT FOR TELE-HEALTH VISIT   I hereby voluntarily request, consent and authorize [facility] and its employed or contracted physicians, physician assistants, nurse practitioners or other licensed health care professionals (the Practitioner), to provide me with telemedicine health care services (the "Services") as deemed necessary by the treating Practitioner. I acknowledge and consent to receive the Services by the Practitioner via telemedicine. I understand that the telemedicine visit will involve communicating with the Practitioner through live audiovisual communication technology and the disclosure of certain medical information by electronic transmission. I acknowledge that I have been given the opportunity to request an in-person assessment or other available alternative prior to the telemedicine visit and am voluntarily participating in the telemedicine visit.  I understand that I have the right to withhold or withdraw my consent to the use of telemedicine in the course of my care at any time, without affecting my right to future care or treatment,  and that the Practitioner or I may terminate the telemedicine visit at any time. I understand that I have the right to inspect all information obtained and/or recorded in the course of the telemedicine visit and may receive copies of available information for a reasonable fee.  I understand that some of the potential risks of receiving the Services via telemedicine include:  Marland Kitchen Delay or interruption in medical evaluation due to technological equipment failure or disruption; . Information transmitted may not be sufficient (e.g. poor resolution of images) to allow for appropriate medical decision making by the Practitioner; and/or  . In rare instances, security protocols could fail, causing a breach of personal health information.  Furthermore, I acknowledge that it is my responsibility to provide information about my medical history, conditions and care that is complete and accurate to the best of my ability. I acknowledge that Practitioner's advice, recommendations, and/or decision may be based on factors not within their control, such as incomplete or inaccurate data provided by me or distortions of diagnostic images or specimens that may result from electronic transmissions. I understand that the practice  of medicine is not an Chief Strategy Officer and that Practitioner makes no warranties or guarantees regarding treatment outcomes. I acknowledge that I will receive a copy of this consent concurrently upon execution via email to the email address I last provided but may also request a printed copy by calling the office of [facility].    I understand that my insurance will be billed for this visit.   I have read or had this consent read to me. . I understand the contents of this consent, which adequately explains the benefits and risks of the Services being provided via telemedicine.  . I have been provided ample opportunity to ask questions regarding this consent and the Services and have had my questions  answered to my satisfaction. . I give my informed consent for the services to be provided through the use of telemedicine in my medical care  By participating in this telemedicine visit I agree to the above.

## 2019-06-29 ENCOUNTER — Encounter: Payer: Self-pay | Admitting: Cardiovascular Disease

## 2019-06-29 ENCOUNTER — Telehealth (INDEPENDENT_AMBULATORY_CARE_PROVIDER_SITE_OTHER): Payer: Medicare Other | Admitting: Cardiovascular Disease

## 2019-06-29 VITALS — BP 138/76 | HR 78 | Ht 72.0 in | Wt 270.0 lb

## 2019-06-29 DIAGNOSIS — I10 Essential (primary) hypertension: Secondary | ICD-10-CM

## 2019-06-29 DIAGNOSIS — I25708 Atherosclerosis of coronary artery bypass graft(s), unspecified, with other forms of angina pectoris: Secondary | ICD-10-CM

## 2019-06-29 DIAGNOSIS — E1169 Type 2 diabetes mellitus with other specified complication: Secondary | ICD-10-CM

## 2019-06-29 NOTE — Progress Notes (Signed)
Virtual Visit via Telephone Note   This visit type was conducted due to national recommendations for restrictions regarding the COVID-19 Pandemic (e.g. social distancing) in an effort to limit this patient's exposure and mitigate transmission in our community.  Due to his co-morbid illnesses, this patient is at least at moderate risk for complications without adequate follow up.  This format is felt to be most appropriate for this patient at this time.  The patient did not have access to video technology/had technical difficulties with video requiring transitioning to audio format only (telephone).  All issues noted in this document were discussed and addressed.  No physical exam could be performed with this format.  Please refer to the patient's chart for his  consent to telehealth for [facility].   Date:  06/29/2019   ID:  Colin Aguirre, DOB 07-Mar-1946, MRN [id number]  Patient Location: Home Provider Location: Office  PCP:  [name], MD  Cardiologist:  Kate Sable, MD  Electrophysiologist:  None   Evaluation Performed:  Follow-Up Visit  Chief Complaint:  CAD  History of Present Illness:    Colin Aguirre is a 74 y.o. male with coronary artery disease. In April 2015, he underwent drug-eluting stent placement to the midportion of the saphenous vein graft to the obtuse marginal as well as drug-eluting stent placement to the native proximal left circumflex coronary artery.  He is doing very well denies exertional chest pain and dyspnea.  He denies leg swelling, palpitations, orthopnea, and dizziness.     Past Medical History:  Diagnosis Date  . CAD (coronary artery disease)    H/O MI AND CABG  . Diabetes mellitus ([place])   . Hyperlipidemia   . Hypertension    Past Surgical History:  Procedure Laterality Date  . CORONARY ARTERY BYPASS GRAFT    . LEFT HEART CATHETERIZATION WITH CORONARY/GRAFT ANGIOGRAM N/A 10/19/2013   Procedure: LEFT HEART CATHETERIZATION  WITH [name];  Surgeon: [name], MD;  Location: [facility] CATH LAB;  Service: Cardiovascular;  Laterality: N/A;  . PERCUTANEOUS CORONARY STENT INTERVENTION (PCI-S)  10/19/2013   Procedure: PERCUTANEOUS CORONARY STENT INTERVENTION (PCI-S);  Surgeon: [name], MD;  Location: [facility] CATH LAB;  Service: Cardiovascular;;     Current Meds  Medication Sig  . aspirin EC 81 MG tablet Take 81 mg by mouth daily.  . clopidogrel (PLAVIX) 75 MG tablet TAKE 1 TABLET DAILY WITH   BREAKFAST  . hydrochlorothiazide (HYDRODIURIL) 25 MG tablet Take 25 mg by mouth daily.  . metFORMIN (GLUCOPHAGE) 850 MG tablet Take 1 tablet by mouth 3 (three) times daily.  . metoprolol (LOPRESSOR) 50 MG tablet Take 25 mg by mouth 2 (two) times daily.  . nitroGLYCERIN (NITROSTAT) 0.4 MG SL tablet Place 1 tablet (0.4 mg total) under the tongue every 5 (five) minutes as needed for chest pain.  . Omega-3 Fatty Acids (FISH OIL) 1200 MG CAPS Take 1,400 mg by mouth daily.   . pioglitazone (ACTOS) 15 MG tablet Take 15 mg by mouth daily.  . ramipril (ALTACE) 10 MG capsule Take 1 capsule (10 mg total) by mouth 2 (two) times daily.  . simvastatin (ZOCOR) 40 MG tablet Take 20 mg by mouth at bedtime.     Allergies:   Patient has no known allergies.   Social History   Tobacco Use  . Smoking status: Former Smoker    Packs/day: 1.00    Years: 44.00    Pack years: 44.00    Types: Cigarettes  Start date: 06/22/1961    Quit date: 06/22/2000    Years since quitting: 19.0  . Smokeless tobacco: Never Used  Substance Use Topics  . Alcohol use: Not on file  . Drug use: Not on file     Family Hx: The patient's family history includes Breast cancer in his mother; Heart disease in his father.  ROS:   Please see the history of present illness.     All other systems reviewed and are negative.   Prior CV studies:   The following studies were reviewed today:  Echocardiogram 06/29/2018:  - Left ventricle:  The cavity size was normal. Wall thickness was   normal. Systolic function was normal. The estimated ejection   fraction was in the range of 50% to 55%. Doppler parameters are   consistent with abnormal left ventricular relaxation (grade 1   diastolic dysfunction). - Aortic valve: Mildly calcified annulus. Trileaflet; mildly   thickened leaflets. Valve area (VTI): 2.49 cm^2. Valve area   (Vmax): 2.11 cm^2. Valve area (Vmean): 1.94 cm^2. - Mitral valve: Mildly calcified annulus. Mildly thickened leaflets   . - Technically difficult study.  Labs/Other Tests and Data Reviewed:    EKG:  No ECG reviewed.  Recent Labs: No results found for requested labs within last 8760 hours.   Recent Lipid Panel No results found for: CHOL, TRIG, HDL, CHOLHDL, LDLCALC, LDLDIRECT  Wt Readings from Last 3 Encounters:  06/29/19 270 lb (122.5 kg)  06/21/18 267 lb (121.1 kg)  06/14/17 255 lb (115.7 kg)     Objective:    Vital Signs:  BP 138/76   Pulse 78   Ht 6' (1.829 m)   Wt 270 lb (122.5 kg)   BMI 36.62 kg/m    VITAL SIGNS:  reviewed  ASSESSMENT & PLAN:    1. CAD/CABG s/p DES x 2 to mid SVG to OM and proximal circumflex: Symptomatically stable. Continue ASA, metoprolol,and statin. I will discontinue clopidogrel.  2. Essential hypertension:Controlled. No changes.  3. Dyslipidemia: Currently on simvastatin 40 mg.  No changes.   COVID-19 Education: The signs and symptoms of COVID-19 were discussed with the patient and how to seek care for testing (follow up with PCP or arrange E-visit).  The importance of social distancing was discussed today.  Time:   Today, I have spent 10 minutes with the patient with telehealth technology discussing the above problems.     Medication Adjustments/Labs and Tests Ordered: Current medicines are reviewed at length with the patient today.  Concerns regarding medicines are outlined above.   Tests Ordered: No orders of the defined types were  placed in this encounter.   Medication Changes: No orders of the defined types were placed in this encounter.   Follow Up:  Either In Person or Virtual in 1 year(s)  Signed, Kate Sable, MD  06/29/2019 9:04 AM    [facility]

## 2019-06-29 NOTE — Addendum Note (Signed)
Addended by: Laurine Blazer on: 06/29/2019 09:33 AM   Modules accepted: Orders

## 2019-06-29 NOTE — Patient Instructions (Signed)
Medication Instructions:   Stop Plavix (Clopidogrel).   Continue all other medications.    Labwork: none  Testing/Procedures: none  Follow-Up: Your physician wants you to follow up in:  1 year.  You will receive a reminder letter in the mail one-two months in advance.  If you don't receive a letter, please call our office to schedule the follow up appointment   Any Other Special Instructions Will Be Listed Below (If Applicable).  If you need a refill on your cardiac medications before your next appointment, please call your pharmacy.  

## 2019-07-21 DIAGNOSIS — E1165 Type 2 diabetes mellitus with hyperglycemia: Secondary | ICD-10-CM | POA: Diagnosis not present

## 2019-08-01 DIAGNOSIS — L570 Actinic keratosis: Secondary | ICD-10-CM | POA: Diagnosis not present

## 2019-08-01 DIAGNOSIS — L578 Other skin changes due to chronic exposure to nonionizing radiation: Secondary | ICD-10-CM | POA: Diagnosis not present

## 2019-08-01 DIAGNOSIS — L821 Other seborrheic keratosis: Secondary | ICD-10-CM | POA: Diagnosis not present

## 2019-08-01 DIAGNOSIS — L905 Scar conditions and fibrosis of skin: Secondary | ICD-10-CM | POA: Diagnosis not present

## 2019-08-01 DIAGNOSIS — Z85828 Personal history of other malignant neoplasm of skin: Secondary | ICD-10-CM | POA: Diagnosis not present

## 2019-08-01 DIAGNOSIS — D692 Other nonthrombocytopenic purpura: Secondary | ICD-10-CM | POA: Diagnosis not present

## 2019-08-17 DIAGNOSIS — I10 Essential (primary) hypertension: Secondary | ICD-10-CM | POA: Diagnosis not present

## 2019-08-17 DIAGNOSIS — E7849 Other hyperlipidemia: Secondary | ICD-10-CM | POA: Diagnosis not present

## 2019-08-18 DIAGNOSIS — E7849 Other hyperlipidemia: Secondary | ICD-10-CM | POA: Diagnosis not present

## 2019-08-18 DIAGNOSIS — I10 Essential (primary) hypertension: Secondary | ICD-10-CM | POA: Diagnosis not present

## 2019-08-28 DIAGNOSIS — E1142 Type 2 diabetes mellitus with diabetic polyneuropathy: Secondary | ICD-10-CM | POA: Diagnosis not present

## 2019-08-28 DIAGNOSIS — R5383 Other fatigue: Secondary | ICD-10-CM | POA: Diagnosis not present

## 2019-08-28 DIAGNOSIS — E0859 Diabetes mellitus due to underlying condition with other circulatory complications: Secondary | ICD-10-CM | POA: Diagnosis not present

## 2019-08-28 DIAGNOSIS — I10 Essential (primary) hypertension: Secondary | ICD-10-CM | POA: Diagnosis not present

## 2019-08-28 DIAGNOSIS — D519 Vitamin B12 deficiency anemia, unspecified: Secondary | ICD-10-CM | POA: Diagnosis not present

## 2019-08-28 DIAGNOSIS — E782 Mixed hyperlipidemia: Secondary | ICD-10-CM | POA: Diagnosis not present

## 2019-08-28 DIAGNOSIS — R5382 Chronic fatigue, unspecified: Secondary | ICD-10-CM | POA: Diagnosis not present

## 2019-08-30 DIAGNOSIS — E1142 Type 2 diabetes mellitus with diabetic polyneuropathy: Secondary | ICD-10-CM | POA: Diagnosis not present

## 2019-08-30 DIAGNOSIS — E1165 Type 2 diabetes mellitus with hyperglycemia: Secondary | ICD-10-CM | POA: Diagnosis not present

## 2019-08-30 DIAGNOSIS — Z6836 Body mass index (BMI) 36.0-36.9, adult: Secondary | ICD-10-CM | POA: Diagnosis not present

## 2019-08-30 DIAGNOSIS — E0859 Diabetes mellitus due to underlying condition with other circulatory complications: Secondary | ICD-10-CM | POA: Diagnosis not present

## 2019-08-30 DIAGNOSIS — I2589 Other forms of chronic ischemic heart disease: Secondary | ICD-10-CM | POA: Diagnosis not present

## 2019-08-30 DIAGNOSIS — E538 Deficiency of other specified B group vitamins: Secondary | ICD-10-CM | POA: Diagnosis not present

## 2019-08-30 DIAGNOSIS — E1129 Type 2 diabetes mellitus with other diabetic kidney complication: Secondary | ICD-10-CM | POA: Diagnosis not present

## 2019-08-30 DIAGNOSIS — I10 Essential (primary) hypertension: Secondary | ICD-10-CM | POA: Diagnosis not present

## 2019-09-19 DIAGNOSIS — E7849 Other hyperlipidemia: Secondary | ICD-10-CM | POA: Diagnosis not present

## 2019-09-19 DIAGNOSIS — I10 Essential (primary) hypertension: Secondary | ICD-10-CM | POA: Diagnosis not present

## 2019-09-20 DIAGNOSIS — I10 Essential (primary) hypertension: Secondary | ICD-10-CM | POA: Diagnosis not present

## 2019-09-20 DIAGNOSIS — E7849 Other hyperlipidemia: Secondary | ICD-10-CM | POA: Diagnosis not present

## 2019-09-27 DIAGNOSIS — R195 Other fecal abnormalities: Secondary | ICD-10-CM | POA: Diagnosis not present

## 2019-10-19 DIAGNOSIS — I10 Essential (primary) hypertension: Secondary | ICD-10-CM | POA: Diagnosis not present

## 2019-10-19 DIAGNOSIS — E7849 Other hyperlipidemia: Secondary | ICD-10-CM | POA: Diagnosis not present

## 2019-10-20 DIAGNOSIS — E7849 Other hyperlipidemia: Secondary | ICD-10-CM | POA: Diagnosis not present

## 2019-10-20 DIAGNOSIS — I10 Essential (primary) hypertension: Secondary | ICD-10-CM | POA: Diagnosis not present

## 2019-12-06 DIAGNOSIS — Z6838 Body mass index (BMI) 38.0-38.9, adult: Secondary | ICD-10-CM | POA: Diagnosis not present

## 2019-12-06 DIAGNOSIS — K635 Polyp of colon: Secondary | ICD-10-CM | POA: Diagnosis not present

## 2019-12-06 DIAGNOSIS — E669 Obesity, unspecified: Secondary | ICD-10-CM | POA: Diagnosis not present

## 2019-12-06 DIAGNOSIS — I10 Essential (primary) hypertension: Secondary | ICD-10-CM | POA: Diagnosis not present

## 2019-12-06 DIAGNOSIS — K621 Rectal polyp: Secondary | ICD-10-CM | POA: Diagnosis not present

## 2019-12-06 DIAGNOSIS — R195 Other fecal abnormalities: Secondary | ICD-10-CM | POA: Diagnosis not present

## 2019-12-06 DIAGNOSIS — K5730 Diverticulosis of large intestine without perforation or abscess without bleeding: Secondary | ICD-10-CM | POA: Diagnosis not present

## 2019-12-06 DIAGNOSIS — I2510 Atherosclerotic heart disease of native coronary artery without angina pectoris: Secondary | ICD-10-CM | POA: Diagnosis not present

## 2020-01-19 DIAGNOSIS — Z7984 Long term (current) use of oral hypoglycemic drugs: Secondary | ICD-10-CM | POA: Diagnosis not present

## 2020-01-19 DIAGNOSIS — E1165 Type 2 diabetes mellitus with hyperglycemia: Secondary | ICD-10-CM | POA: Diagnosis not present

## 2020-01-19 DIAGNOSIS — E7849 Other hyperlipidemia: Secondary | ICD-10-CM | POA: Diagnosis not present

## 2020-01-19 DIAGNOSIS — I10 Essential (primary) hypertension: Secondary | ICD-10-CM | POA: Diagnosis not present

## 2020-01-30 DIAGNOSIS — Z85828 Personal history of other malignant neoplasm of skin: Secondary | ICD-10-CM | POA: Diagnosis not present

## 2020-01-30 DIAGNOSIS — L905 Scar conditions and fibrosis of skin: Secondary | ICD-10-CM | POA: Diagnosis not present

## 2020-01-30 DIAGNOSIS — L570 Actinic keratosis: Secondary | ICD-10-CM | POA: Diagnosis not present

## 2020-01-30 DIAGNOSIS — L578 Other skin changes due to chronic exposure to nonionizing radiation: Secondary | ICD-10-CM | POA: Diagnosis not present

## 2020-01-30 DIAGNOSIS — L821 Other seborrheic keratosis: Secondary | ICD-10-CM | POA: Diagnosis not present

## 2020-01-31 DIAGNOSIS — R195 Other fecal abnormalities: Secondary | ICD-10-CM | POA: Diagnosis not present

## 2020-02-20 DIAGNOSIS — I10 Essential (primary) hypertension: Secondary | ICD-10-CM | POA: Diagnosis not present

## 2020-02-20 DIAGNOSIS — E1165 Type 2 diabetes mellitus with hyperglycemia: Secondary | ICD-10-CM | POA: Diagnosis not present

## 2020-02-20 DIAGNOSIS — Z7984 Long term (current) use of oral hypoglycemic drugs: Secondary | ICD-10-CM | POA: Diagnosis not present

## 2020-02-20 DIAGNOSIS — E7849 Other hyperlipidemia: Secondary | ICD-10-CM | POA: Diagnosis not present

## 2020-03-21 DIAGNOSIS — Z7984 Long term (current) use of oral hypoglycemic drugs: Secondary | ICD-10-CM | POA: Diagnosis not present

## 2020-03-21 DIAGNOSIS — I10 Essential (primary) hypertension: Secondary | ICD-10-CM | POA: Diagnosis not present

## 2020-03-21 DIAGNOSIS — E7849 Other hyperlipidemia: Secondary | ICD-10-CM | POA: Diagnosis not present

## 2020-03-21 DIAGNOSIS — E1165 Type 2 diabetes mellitus with hyperglycemia: Secondary | ICD-10-CM | POA: Diagnosis not present

## 2020-04-18 DIAGNOSIS — E7800 Pure hypercholesterolemia, unspecified: Secondary | ICD-10-CM | POA: Diagnosis not present

## 2020-04-18 DIAGNOSIS — D519 Vitamin B12 deficiency anemia, unspecified: Secondary | ICD-10-CM | POA: Diagnosis not present

## 2020-04-18 DIAGNOSIS — E782 Mixed hyperlipidemia: Secondary | ICD-10-CM | POA: Diagnosis not present

## 2020-04-18 DIAGNOSIS — I10 Essential (primary) hypertension: Secondary | ICD-10-CM | POA: Diagnosis not present

## 2020-04-18 DIAGNOSIS — E1142 Type 2 diabetes mellitus with diabetic polyneuropathy: Secondary | ICD-10-CM | POA: Diagnosis not present

## 2020-04-18 DIAGNOSIS — E1165 Type 2 diabetes mellitus with hyperglycemia: Secondary | ICD-10-CM | POA: Diagnosis not present

## 2020-04-18 DIAGNOSIS — E1129 Type 2 diabetes mellitus with other diabetic kidney complication: Secondary | ICD-10-CM | POA: Diagnosis not present

## 2020-04-20 DIAGNOSIS — Z7984 Long term (current) use of oral hypoglycemic drugs: Secondary | ICD-10-CM | POA: Diagnosis not present

## 2020-04-20 DIAGNOSIS — E7849 Other hyperlipidemia: Secondary | ICD-10-CM | POA: Diagnosis not present

## 2020-04-20 DIAGNOSIS — E1165 Type 2 diabetes mellitus with hyperglycemia: Secondary | ICD-10-CM | POA: Diagnosis not present

## 2020-04-20 DIAGNOSIS — I10 Essential (primary) hypertension: Secondary | ICD-10-CM | POA: Diagnosis not present

## 2020-04-24 DIAGNOSIS — E1165 Type 2 diabetes mellitus with hyperglycemia: Secondary | ICD-10-CM | POA: Diagnosis not present

## 2020-04-24 DIAGNOSIS — Z23 Encounter for immunization: Secondary | ICD-10-CM | POA: Diagnosis not present

## 2020-04-24 DIAGNOSIS — E538 Deficiency of other specified B group vitamins: Secondary | ICD-10-CM | POA: Diagnosis not present

## 2020-04-24 DIAGNOSIS — I10 Essential (primary) hypertension: Secondary | ICD-10-CM | POA: Diagnosis not present

## 2020-04-24 DIAGNOSIS — E1142 Type 2 diabetes mellitus with diabetic polyneuropathy: Secondary | ICD-10-CM | POA: Diagnosis not present

## 2020-04-24 DIAGNOSIS — Z0001 Encounter for general adult medical examination with abnormal findings: Secondary | ICD-10-CM | POA: Diagnosis not present

## 2020-04-24 DIAGNOSIS — E1129 Type 2 diabetes mellitus with other diabetic kidney complication: Secondary | ICD-10-CM | POA: Diagnosis not present

## 2020-04-24 DIAGNOSIS — E1122 Type 2 diabetes mellitus with diabetic chronic kidney disease: Secondary | ICD-10-CM | POA: Diagnosis not present

## 2020-05-21 DIAGNOSIS — Z7984 Long term (current) use of oral hypoglycemic drugs: Secondary | ICD-10-CM | POA: Diagnosis not present

## 2020-05-21 DIAGNOSIS — I10 Essential (primary) hypertension: Secondary | ICD-10-CM | POA: Diagnosis not present

## 2020-05-21 DIAGNOSIS — E1165 Type 2 diabetes mellitus with hyperglycemia: Secondary | ICD-10-CM | POA: Diagnosis not present

## 2020-05-21 DIAGNOSIS — E7849 Other hyperlipidemia: Secondary | ICD-10-CM | POA: Diagnosis not present

## 2020-06-21 DIAGNOSIS — Z7984 Long term (current) use of oral hypoglycemic drugs: Secondary | ICD-10-CM | POA: Diagnosis not present

## 2020-06-21 DIAGNOSIS — E7849 Other hyperlipidemia: Secondary | ICD-10-CM | POA: Diagnosis not present

## 2020-06-21 DIAGNOSIS — I10 Essential (primary) hypertension: Secondary | ICD-10-CM | POA: Diagnosis not present

## 2020-06-21 DIAGNOSIS — E1165 Type 2 diabetes mellitus with hyperglycemia: Secondary | ICD-10-CM | POA: Diagnosis not present

## 2020-07-20 DIAGNOSIS — Z7984 Long term (current) use of oral hypoglycemic drugs: Secondary | ICD-10-CM | POA: Diagnosis not present

## 2020-07-20 DIAGNOSIS — I10 Essential (primary) hypertension: Secondary | ICD-10-CM | POA: Diagnosis not present

## 2020-07-20 DIAGNOSIS — E7849 Other hyperlipidemia: Secondary | ICD-10-CM | POA: Diagnosis not present

## 2020-07-20 DIAGNOSIS — E1165 Type 2 diabetes mellitus with hyperglycemia: Secondary | ICD-10-CM | POA: Diagnosis not present

## 2020-08-05 NOTE — Progress Notes (Signed)
Cardiology Office Note    Date:  08/07/2020   ID:  Colin Aguirre, Colin Aguirre 12, 1947, MRN [id number]   PCP:  [name], MD   [place]  Cardiologist:  No primary care provider on file.   Advanced Practice Provider:  No care team member to display Electrophysiologist:  None   :[id number]}   Chief Complaint  Patient presents with  . Follow-up    History of Present Illness:  Colin Aguirre is a 75 y.o. male with history of CAD post CABG times 10/2000, status post DES to the mid SVG to OM and DES to native proximal left circumflex 2015, hypertension, HLD, DM 2.  Patient last had telemedicine visit 06/29/2019 and was doing well without chest pain.  Plavix was stopped.  Patient comes in for yearly f/u. Denies chest pain, dyspnea, dizziness, edema. No regular exercise since it's been cold. Has low back pain when standing long. When walking in [employer] to the back of the store it feels like the back of his legs become numb. No pain.    Past Medical History:  Diagnosis Date  . CAD (coronary artery disease)    H/O MI AND CABG  . Diabetes mellitus ([place])   . Hyperlipidemia   . Hypertension     Past Surgical History:  Procedure Laterality Date  . CORONARY ARTERY BYPASS GRAFT    . LEFT HEART CATHETERIZATION WITH CORONARY/GRAFT ANGIOGRAM N/A 10/19/2013   Procedure: LEFT HEART CATHETERIZATION WITH [name];  Surgeon: [name], MD;  Location: [facility] CATH LAB;  Service: Cardiovascular;  Laterality: N/A;  . PERCUTANEOUS CORONARY STENT INTERVENTION (PCI-S)  10/19/2013   Procedure: PERCUTANEOUS CORONARY STENT INTERVENTION (PCI-S);  Surgeon: [name], MD;  Location: [facility] CATH LAB;  Service: Cardiovascular;;    Current Medications: Current Meds  Medication Sig  . aspirin EC 81 MG tablet Take 81 mg by mouth daily.  . dapagliflozin propanediol (FARXIGA) 10 MG TABS tablet Take 10 mg by mouth daily.  . hydrochlorothiazide  (HYDRODIURIL) 25 MG tablet Take 25 mg by mouth daily.  . metFORMIN (GLUCOPHAGE) 850 MG tablet Take 1 tablet by mouth 3 (three) times daily.  . metoprolol (LOPRESSOR) 50 MG tablet Take 25 mg by mouth 2 (two) times daily.  . nitroGLYCERIN (NITROSTAT) 0.4 MG SL tablet Place 1 tablet (0.4 mg total) under the tongue every 5 (five) minutes as needed for chest pain.  . Omega-3 Fatty Acids (FISH OIL) 1200 MG CAPS Take 1,400 mg by mouth daily.   . ramipril (ALTACE) 10 MG capsule Take 1 capsule (10 mg total) by mouth 2 (two) times daily.  . Semaglutide,0.25 or 0.5MG /DOS, (OZEMPIC, 0.25 OR 0.5 MG/DOSE,) 2 MG/1.5ML SOPN Inject 1 mg into the skin once a week.  . simvastatin (ZOCOR) 40 MG tablet Take 20 mg by mouth at bedtime.     Allergies:   Patient has no known allergies.   Social History   Socioeconomic History  . Marital status: Married    Spouse name: Not on file  . Number of children: Not on file  . Years of education: Not on file  . Highest education level: Not on file  Occupational History  . Not on file  Tobacco Use  . Smoking status: Former Smoker    Packs/day: 1.00    Years: 44.00    Pack years: 44.00    Types: Cigarettes    Start date: 06/22/1961    Quit date: 06/22/2000    Years  since quitting: 20.1  . Smokeless tobacco: Never Used  Substance and Sexual Activity  . Alcohol use: Not on file  . Drug use: Not on file  . Sexual activity: Not on file  Other Topics Concern  . Not on file  Social History Narrative  . Not on file   Social Determinants of Health   Financial Resource Strain: Not on file  Food Insecurity: Not on file  Transportation Needs: Not on file  Physical Activity: Not on file  Stress: Not on file  Social Connections: Not on file     Family History:  The patient's family history includes Breast cancer in his mother; Heart disease in his father.   ROS:   Please see the history of present illness.    ROS All other systems reviewed and are  negative.   PHYSICAL EXAM:   VS:  BP (!) 148/80   Pulse 78   Ht 6' (1.829 m)   Wt 242 lb 9.6 oz (110 kg)   SpO2 97%   BMI 32.90 kg/m   Physical Exam  PJK:DTOIZ, in no acute distress  Neck: no JVD, carotid bruits, or masses Cardiac:RRR; no murmurs, rubs, or gallops  Respiratory:  clear to auscultation bilaterally, normal work of breathing GI: soft, nontender, nondistended, + BS Ext: without cyanosis, clubbing, or edema, Good distal pulses bilaterally Neuro:  Alert and Oriented x 3 Psych: euthymic mood, full affect  Wt Readings from Last 3 Encounters:  08/07/20 242 lb 9.6 oz (110 kg)  06/29/19 270 lb (122.5 kg)  06/21/18 267 lb (121.1 kg)      Studies/Labs Reviewed:   EKG:  EKG is ordered today.  The ekg ordered today demonstrates NSR, normal EKG  Recent Labs: No results found for requested labs within last 8760 hours.   Lipid Panel No results found for: CHOL, TRIG, HDL, CHOLHDL, VLDL, LDLCALC, LDLDIRECT  Additional studies/ records that were reviewed today include:   Echocardiogram 06/29/2018:   - Left ventricle: The cavity size was normal. Wall thickness was   normal. Systolic function was normal. The estimated ejection   fraction was in the range of 50% to 55%. Doppler parameters are   consistent with abnormal left ventricular relaxation (grade 1   diastolic dysfunction). - Aortic valve: Mildly calcified annulus. Trileaflet; mildly   thickened leaflets. Valve area (VTI): 2.49 cm^2. Valve area   (Vmax): 2.11 cm^2. Valve area (Vmean): 1.94 cm^2. - Mitral valve: Mildly calcified annulus. Mildly thickened leaflets   . - Technically difficult study.  Cardiac cath 2015 Impression: 1. Severe triple vessel CAD s/p 5V CABG with 5/5 patent bypass grafts 2. Unstable angina secondary to severe stenosis SVG to OM and severe stenosis native Circumflex unprotected by grafting.   3. Successful PTCA/DES x 1 SVG to OM 4. Successful PTCA/DES x 1 proximal  Circumflex  Recommendations: He will need dual antiplatelet therapy with ASA and Plavix for lifetime. D/c home in am if stable.            Risk Assessment/Calculations:         ASSESSMENT:    1. Coronary artery disease involving coronary bypass graft of native heart without angina pectoris   2. Essential hypertension   3. Hyperlipidemia, unspecified hyperlipidemia type   4. Diabetes mellitus due to underlying condition with hyperosmolarity without coma, without long-term current use of insulin ([place])   5. Morbid obesity ([place])      PLAN:  In order of problems listed above:  CAD status post  CABG times 10/2000, DES to the SVG to OM and DES to the proximal circumflex 2015, no angina.Continue ASA, metoprolol, ramipril, zocor  Hypertension BP controlled  Hyperlipidemia on zocor followed by PCP-to have labs in April -ask to send to [place].  DM2 managed by PCP  Obesity-has lost 40 lbs watching diet.   Shared Decision Making/Informed Consent        Medication Adjustments/Labs and Tests Ordered: Current medicines are reviewed at length with the patient today.  Concerns regarding medicines are outlined above.  Medication changes, Labs and Tests ordered today are listed in the Patient Instructions below. Patient Instructions       Signed, Ermalinda Barrios, PA-C  08/07/2020 9:55 AM    [facility] [address], [place], [place]  [address] Phone: [phone]; Fax: [phone]

## 2020-08-07 ENCOUNTER — Ambulatory Visit (INDEPENDENT_AMBULATORY_CARE_PROVIDER_SITE_OTHER): Payer: Medicare Other | Admitting: Physician Assistant

## 2020-08-07 ENCOUNTER — Encounter: Payer: Self-pay | Admitting: Physician Assistant

## 2020-08-07 VITALS — BP 148/80 | HR 78 | Ht 72.0 in | Wt 242.6 lb

## 2020-08-07 DIAGNOSIS — I25810 Atherosclerosis of coronary artery bypass graft(s) without angina pectoris: Secondary | ICD-10-CM | POA: Diagnosis not present

## 2020-08-07 DIAGNOSIS — E0800 Diabetes mellitus due to underlying condition with hyperosmolarity without nonketotic hyperglycemic-hyperosmolar coma (NKHHC): Secondary | ICD-10-CM | POA: Diagnosis not present

## 2020-08-07 DIAGNOSIS — I10 Essential (primary) hypertension: Secondary | ICD-10-CM

## 2020-08-07 DIAGNOSIS — E785 Hyperlipidemia, unspecified: Secondary | ICD-10-CM | POA: Diagnosis not present

## 2020-08-07 NOTE — Patient Instructions (Addendum)
  Medication Instructions:  Your physician recommends that you continue on your current medications as directed. Please refer to the Current Medication list given to you today.  *If you need a refill on your cardiac medications before your next appointment, please call your pharmacy*   Lab Work: None If you have labs (blood work) drawn today and your tests are completely normal, you will receive your results only by: Marland Kitchen MyChart Message (if you have MyChart) OR . A paper copy in the mail If you have any lab test that is abnormal or we need to change your treatment, we will call you to review the results.   Testing/Procedures: None   Follow-Up: At [facility], you and your health needs are our priority.  As part of our continuing mission to provide you with exceptional heart care, we have created designated Provider Care Teams.  These Care Teams include your primary Cardiologist (physician) and Advanced Practice Providers (APPs -  Physician Assistants and Nurse Practitioners) who all work together to provide you with the care you need, when you need it.  We recommend signing up for the patient portal called "MyChart".  Sign up information is provided on this After Visit Summary.  MyChart is used to connect with patients for Virtual Visits (Telemedicine).  Patients are able to view lab/test results, encounter notes, upcoming appointments, etc.  Non-urgent messages can be sent to your provider as well.   To learn more about what you can do with MyChart, go to [email or web address].    Your next appointment:   1 year(s)  The format for your next appointment:   In Person  Provider:   Rozann Lesches, MD or Carlyle Dolly, MD   Other Instructions Your provider recommends that you maintain 150 minutes per week of moderate aerobic activity.

## 2020-08-09 ENCOUNTER — Telehealth: Payer: Self-pay

## 2020-08-09 ENCOUNTER — Other Ambulatory Visit: Payer: Self-pay

## 2020-08-09 DIAGNOSIS — E785 Hyperlipidemia, unspecified: Secondary | ICD-10-CM

## 2020-08-09 MED ORDER — SIMVASTATIN 40 MG PO TABS: 40.0000 mg | ORAL_TABLET | Freq: Every day | ORAL | 3 refills | Status: DC

## 2020-08-09 NOTE — Telephone Encounter (Signed)
Patient contacted and verbalized understanding. Patient had no questions or concerns. Lab work and medication changes put in.

## 2020-08-09 NOTE — Telephone Encounter (Signed)
-----   Message from Imogene Burn, PA-C sent at 08/08/2020  4:43 PM EST ----- I reviewed your labs from your PCP. LDL is 80 and should be less than 70. Would increase zocor 40 mg daily. Repeat FLP in 3 months.thanks

## 2020-08-15 DIAGNOSIS — I10 Essential (primary) hypertension: Secondary | ICD-10-CM | POA: Diagnosis not present

## 2020-08-15 DIAGNOSIS — E1122 Type 2 diabetes mellitus with diabetic chronic kidney disease: Secondary | ICD-10-CM | POA: Diagnosis not present

## 2020-08-15 DIAGNOSIS — N183 Chronic kidney disease, stage 3 unspecified: Secondary | ICD-10-CM | POA: Diagnosis not present

## 2020-08-15 DIAGNOSIS — E782 Mixed hyperlipidemia: Secondary | ICD-10-CM | POA: Diagnosis not present

## 2020-08-15 DIAGNOSIS — E7849 Other hyperlipidemia: Secondary | ICD-10-CM | POA: Diagnosis not present

## 2020-08-15 DIAGNOSIS — E538 Deficiency of other specified B group vitamins: Secondary | ICD-10-CM | POA: Diagnosis not present

## 2020-08-19 DIAGNOSIS — Z7984 Long term (current) use of oral hypoglycemic drugs: Secondary | ICD-10-CM | POA: Diagnosis not present

## 2020-08-19 DIAGNOSIS — E7849 Other hyperlipidemia: Secondary | ICD-10-CM | POA: Diagnosis not present

## 2020-08-19 DIAGNOSIS — E1165 Type 2 diabetes mellitus with hyperglycemia: Secondary | ICD-10-CM | POA: Diagnosis not present

## 2020-08-19 DIAGNOSIS — I10 Essential (primary) hypertension: Secondary | ICD-10-CM | POA: Diagnosis not present

## 2020-08-21 DIAGNOSIS — E1165 Type 2 diabetes mellitus with hyperglycemia: Secondary | ICD-10-CM | POA: Diagnosis not present

## 2020-08-21 DIAGNOSIS — E1122 Type 2 diabetes mellitus with diabetic chronic kidney disease: Secondary | ICD-10-CM | POA: Diagnosis not present

## 2020-08-21 DIAGNOSIS — E538 Deficiency of other specified B group vitamins: Secondary | ICD-10-CM | POA: Diagnosis not present

## 2020-08-21 DIAGNOSIS — E1142 Type 2 diabetes mellitus with diabetic polyneuropathy: Secondary | ICD-10-CM | POA: Diagnosis not present

## 2020-08-21 DIAGNOSIS — E7849 Other hyperlipidemia: Secondary | ICD-10-CM | POA: Diagnosis not present

## 2020-08-21 DIAGNOSIS — E1129 Type 2 diabetes mellitus with other diabetic kidney complication: Secondary | ICD-10-CM | POA: Diagnosis not present

## 2020-08-21 DIAGNOSIS — E1159 Type 2 diabetes mellitus with other circulatory complications: Secondary | ICD-10-CM | POA: Diagnosis not present

## 2020-08-21 DIAGNOSIS — I10 Essential (primary) hypertension: Secondary | ICD-10-CM | POA: Diagnosis not present

## 2020-08-22 ENCOUNTER — Telehealth: Payer: Self-pay

## 2020-08-22 ENCOUNTER — Other Ambulatory Visit: Payer: Self-pay

## 2020-08-22 DIAGNOSIS — E785 Hyperlipidemia, unspecified: Secondary | ICD-10-CM

## 2020-08-22 NOTE — Telephone Encounter (Signed)
-----   Message from Imogene Burn, PA-C sent at 08/22/2020  9:09 AM EST ----- Labs reviewed and LDL above goal of 70. Triglycerides also high probably related to diabetes. Increase zocor 40 mg daily, reduce simple sugars in diet. Thanks. Repeat flp in 3 months

## 2020-08-22 NOTE — Telephone Encounter (Signed)
Patient is currently on Zocor 40 mg daily and agreed to have a FLP in 3 months. Patient had no questions or concerns at this time.

## 2020-09-18 DIAGNOSIS — Z7984 Long term (current) use of oral hypoglycemic drugs: Secondary | ICD-10-CM | POA: Diagnosis not present

## 2020-09-18 DIAGNOSIS — E1165 Type 2 diabetes mellitus with hyperglycemia: Secondary | ICD-10-CM | POA: Diagnosis not present

## 2020-09-18 DIAGNOSIS — E7849 Other hyperlipidemia: Secondary | ICD-10-CM | POA: Diagnosis not present

## 2020-09-18 DIAGNOSIS — I10 Essential (primary) hypertension: Secondary | ICD-10-CM | POA: Diagnosis not present

## 2020-10-19 DIAGNOSIS — E7849 Other hyperlipidemia: Secondary | ICD-10-CM | POA: Diagnosis not present

## 2020-10-19 DIAGNOSIS — Z7984 Long term (current) use of oral hypoglycemic drugs: Secondary | ICD-10-CM | POA: Diagnosis not present

## 2020-10-19 DIAGNOSIS — E1165 Type 2 diabetes mellitus with hyperglycemia: Secondary | ICD-10-CM | POA: Diagnosis not present

## 2020-10-19 DIAGNOSIS — I10 Essential (primary) hypertension: Secondary | ICD-10-CM | POA: Diagnosis not present

## 2020-11-18 DIAGNOSIS — I10 Essential (primary) hypertension: Secondary | ICD-10-CM | POA: Diagnosis not present

## 2020-11-18 DIAGNOSIS — Z7984 Long term (current) use of oral hypoglycemic drugs: Secondary | ICD-10-CM | POA: Diagnosis not present

## 2020-11-18 DIAGNOSIS — E7849 Other hyperlipidemia: Secondary | ICD-10-CM | POA: Diagnosis not present

## 2020-11-18 DIAGNOSIS — E1165 Type 2 diabetes mellitus with hyperglycemia: Secondary | ICD-10-CM | POA: Diagnosis not present

## 2020-12-11 DIAGNOSIS — E782 Mixed hyperlipidemia: Secondary | ICD-10-CM | POA: Diagnosis not present

## 2020-12-11 DIAGNOSIS — D519 Vitamin B12 deficiency anemia, unspecified: Secondary | ICD-10-CM | POA: Diagnosis not present

## 2020-12-11 DIAGNOSIS — E1122 Type 2 diabetes mellitus with diabetic chronic kidney disease: Secondary | ICD-10-CM | POA: Diagnosis not present

## 2020-12-11 DIAGNOSIS — E7849 Other hyperlipidemia: Secondary | ICD-10-CM | POA: Diagnosis not present

## 2020-12-11 DIAGNOSIS — N183 Chronic kidney disease, stage 3 unspecified: Secondary | ICD-10-CM | POA: Diagnosis not present

## 2020-12-11 DIAGNOSIS — R5382 Chronic fatigue, unspecified: Secondary | ICD-10-CM | POA: Diagnosis not present

## 2020-12-11 DIAGNOSIS — R5383 Other fatigue: Secondary | ICD-10-CM | POA: Diagnosis not present

## 2020-12-17 DIAGNOSIS — E1165 Type 2 diabetes mellitus with hyperglycemia: Secondary | ICD-10-CM | POA: Diagnosis not present

## 2020-12-17 DIAGNOSIS — E538 Deficiency of other specified B group vitamins: Secondary | ICD-10-CM | POA: Diagnosis not present

## 2020-12-17 DIAGNOSIS — Z23 Encounter for immunization: Secondary | ICD-10-CM | POA: Diagnosis not present

## 2020-12-17 DIAGNOSIS — I10 Essential (primary) hypertension: Secondary | ICD-10-CM | POA: Diagnosis not present

## 2020-12-17 DIAGNOSIS — E1129 Type 2 diabetes mellitus with other diabetic kidney complication: Secondary | ICD-10-CM | POA: Diagnosis not present

## 2020-12-17 DIAGNOSIS — E7849 Other hyperlipidemia: Secondary | ICD-10-CM | POA: Diagnosis not present

## 2020-12-17 DIAGNOSIS — E1122 Type 2 diabetes mellitus with diabetic chronic kidney disease: Secondary | ICD-10-CM | POA: Diagnosis not present

## 2020-12-17 DIAGNOSIS — E1142 Type 2 diabetes mellitus with diabetic polyneuropathy: Secondary | ICD-10-CM | POA: Diagnosis not present

## 2020-12-19 DIAGNOSIS — Z7984 Long term (current) use of oral hypoglycemic drugs: Secondary | ICD-10-CM | POA: Diagnosis not present

## 2020-12-19 DIAGNOSIS — E1165 Type 2 diabetes mellitus with hyperglycemia: Secondary | ICD-10-CM | POA: Diagnosis not present

## 2020-12-19 DIAGNOSIS — E7849 Other hyperlipidemia: Secondary | ICD-10-CM | POA: Diagnosis not present

## 2020-12-19 DIAGNOSIS — I10 Essential (primary) hypertension: Secondary | ICD-10-CM | POA: Diagnosis not present

## 2020-12-26 ENCOUNTER — Telehealth: Payer: Self-pay

## 2020-12-26 DIAGNOSIS — E782 Mixed hyperlipidemia: Secondary | ICD-10-CM

## 2020-12-26 NOTE — Telephone Encounter (Signed)
I spoke with patient and he reports taking zocor 20 mg hs. He agrees to increase to 40 mg and will let us know if he tolerates the higher dose. He said he had myalgias before. He will call us back if he has problems. I will mail lab slip.

## 2020-12-26 NOTE — Telephone Encounter (Signed)
-----   Message from Imogene Burn, PA-C sent at 12/24/2020  9:36 AM EDT ----- LDL high at 88 and triglycerides and A1C high. Please increase Zocor 40 mg once daily and recheck  fasting lipids in 3 months. Needs better diabetes control with PCP. No other changes. Thanks for having labs sent.

## 2021-01-19 DIAGNOSIS — E7849 Other hyperlipidemia: Secondary | ICD-10-CM | POA: Diagnosis not present

## 2021-01-19 DIAGNOSIS — E1165 Type 2 diabetes mellitus with hyperglycemia: Secondary | ICD-10-CM | POA: Diagnosis not present

## 2021-01-19 DIAGNOSIS — I10 Essential (primary) hypertension: Secondary | ICD-10-CM | POA: Diagnosis not present

## 2021-01-19 DIAGNOSIS — Z7984 Long term (current) use of oral hypoglycemic drugs: Secondary | ICD-10-CM | POA: Diagnosis not present

## 2021-02-19 DIAGNOSIS — I10 Essential (primary) hypertension: Secondary | ICD-10-CM | POA: Diagnosis not present

## 2021-02-19 DIAGNOSIS — Z7984 Long term (current) use of oral hypoglycemic drugs: Secondary | ICD-10-CM | POA: Diagnosis not present

## 2021-02-19 DIAGNOSIS — E7849 Other hyperlipidemia: Secondary | ICD-10-CM | POA: Diagnosis not present

## 2021-02-19 DIAGNOSIS — E1165 Type 2 diabetes mellitus with hyperglycemia: Secondary | ICD-10-CM | POA: Diagnosis not present

## 2021-03-21 DIAGNOSIS — E7849 Other hyperlipidemia: Secondary | ICD-10-CM | POA: Diagnosis not present

## 2021-03-21 DIAGNOSIS — E1165 Type 2 diabetes mellitus with hyperglycemia: Secondary | ICD-10-CM | POA: Diagnosis not present

## 2021-03-21 DIAGNOSIS — Z7984 Long term (current) use of oral hypoglycemic drugs: Secondary | ICD-10-CM | POA: Diagnosis not present

## 2021-03-21 DIAGNOSIS — I10 Essential (primary) hypertension: Secondary | ICD-10-CM | POA: Diagnosis not present

## 2021-04-17 DIAGNOSIS — E1122 Type 2 diabetes mellitus with diabetic chronic kidney disease: Secondary | ICD-10-CM | POA: Diagnosis not present

## 2021-04-17 DIAGNOSIS — N183 Chronic kidney disease, stage 3 unspecified: Secondary | ICD-10-CM | POA: Diagnosis not present

## 2021-04-17 DIAGNOSIS — R5382 Chronic fatigue, unspecified: Secondary | ICD-10-CM | POA: Diagnosis not present

## 2021-04-17 DIAGNOSIS — D519 Vitamin B12 deficiency anemia, unspecified: Secondary | ICD-10-CM | POA: Diagnosis not present

## 2021-04-17 DIAGNOSIS — R5383 Other fatigue: Secondary | ICD-10-CM | POA: Diagnosis not present

## 2021-04-17 DIAGNOSIS — E7849 Other hyperlipidemia: Secondary | ICD-10-CM | POA: Diagnosis not present

## 2021-04-17 DIAGNOSIS — E782 Mixed hyperlipidemia: Secondary | ICD-10-CM | POA: Diagnosis not present

## 2021-04-17 DIAGNOSIS — I10 Essential (primary) hypertension: Secondary | ICD-10-CM | POA: Diagnosis not present

## 2021-04-21 DIAGNOSIS — E1142 Type 2 diabetes mellitus with diabetic polyneuropathy: Secondary | ICD-10-CM | POA: Diagnosis not present

## 2021-04-21 DIAGNOSIS — I10 Essential (primary) hypertension: Secondary | ICD-10-CM | POA: Diagnosis not present

## 2021-04-21 DIAGNOSIS — E7849 Other hyperlipidemia: Secondary | ICD-10-CM | POA: Diagnosis not present

## 2021-04-21 DIAGNOSIS — E1129 Type 2 diabetes mellitus with other diabetic kidney complication: Secondary | ICD-10-CM | POA: Diagnosis not present

## 2021-04-21 DIAGNOSIS — E538 Deficiency of other specified B group vitamins: Secondary | ICD-10-CM | POA: Diagnosis not present

## 2021-04-21 DIAGNOSIS — E1122 Type 2 diabetes mellitus with diabetic chronic kidney disease: Secondary | ICD-10-CM | POA: Diagnosis not present

## 2021-04-21 DIAGNOSIS — E1159 Type 2 diabetes mellitus with other circulatory complications: Secondary | ICD-10-CM | POA: Diagnosis not present

## 2021-04-21 DIAGNOSIS — Z7984 Long term (current) use of oral hypoglycemic drugs: Secondary | ICD-10-CM | POA: Diagnosis not present

## 2021-04-21 DIAGNOSIS — E1165 Type 2 diabetes mellitus with hyperglycemia: Secondary | ICD-10-CM | POA: Diagnosis not present

## 2021-04-21 DIAGNOSIS — Z23 Encounter for immunization: Secondary | ICD-10-CM | POA: Diagnosis not present

## 2021-04-28 DIAGNOSIS — I10 Essential (primary) hypertension: Secondary | ICD-10-CM | POA: Diagnosis not present

## 2021-04-28 DIAGNOSIS — E7849 Other hyperlipidemia: Secondary | ICD-10-CM | POA: Diagnosis not present

## 2021-04-28 DIAGNOSIS — Z6829 Body mass index (BMI) 29.0-29.9, adult: Secondary | ICD-10-CM | POA: Diagnosis not present

## 2021-04-28 DIAGNOSIS — Z0001 Encounter for general adult medical examination with abnormal findings: Secondary | ICD-10-CM | POA: Diagnosis not present

## 2021-04-28 DIAGNOSIS — E1122 Type 2 diabetes mellitus with diabetic chronic kidney disease: Secondary | ICD-10-CM | POA: Diagnosis not present

## 2021-05-21 DIAGNOSIS — E7849 Other hyperlipidemia: Secondary | ICD-10-CM | POA: Diagnosis not present

## 2021-05-21 DIAGNOSIS — Z7984 Long term (current) use of oral hypoglycemic drugs: Secondary | ICD-10-CM | POA: Diagnosis not present

## 2021-05-21 DIAGNOSIS — E1165 Type 2 diabetes mellitus with hyperglycemia: Secondary | ICD-10-CM | POA: Diagnosis not present

## 2021-05-21 DIAGNOSIS — I10 Essential (primary) hypertension: Secondary | ICD-10-CM | POA: Diagnosis not present

## 2021-06-17 DIAGNOSIS — Z20822 Contact with and (suspected) exposure to covid-19: Secondary | ICD-10-CM | POA: Diagnosis not present

## 2021-06-20 DIAGNOSIS — I10 Essential (primary) hypertension: Secondary | ICD-10-CM | POA: Diagnosis not present

## 2021-06-20 DIAGNOSIS — E7849 Other hyperlipidemia: Secondary | ICD-10-CM | POA: Diagnosis not present

## 2021-06-20 DIAGNOSIS — Z7984 Long term (current) use of oral hypoglycemic drugs: Secondary | ICD-10-CM | POA: Diagnosis not present

## 2021-06-20 DIAGNOSIS — E1165 Type 2 diabetes mellitus with hyperglycemia: Secondary | ICD-10-CM | POA: Diagnosis not present

## 2021-07-20 DIAGNOSIS — E1165 Type 2 diabetes mellitus with hyperglycemia: Secondary | ICD-10-CM | POA: Diagnosis not present

## 2021-07-20 DIAGNOSIS — I10 Essential (primary) hypertension: Secondary | ICD-10-CM | POA: Diagnosis not present

## 2021-07-20 DIAGNOSIS — E7849 Other hyperlipidemia: Secondary | ICD-10-CM | POA: Diagnosis not present

## 2021-08-15 DIAGNOSIS — E538 Deficiency of other specified B group vitamins: Secondary | ICD-10-CM | POA: Diagnosis not present

## 2021-08-15 DIAGNOSIS — E7849 Other hyperlipidemia: Secondary | ICD-10-CM | POA: Diagnosis not present

## 2021-08-15 DIAGNOSIS — I10 Essential (primary) hypertension: Secondary | ICD-10-CM | POA: Diagnosis not present

## 2021-08-15 DIAGNOSIS — E1159 Type 2 diabetes mellitus with other circulatory complications: Secondary | ICD-10-CM | POA: Diagnosis not present

## 2021-08-15 DIAGNOSIS — E1165 Type 2 diabetes mellitus with hyperglycemia: Secondary | ICD-10-CM | POA: Diagnosis not present

## 2021-08-15 DIAGNOSIS — E1142 Type 2 diabetes mellitus with diabetic polyneuropathy: Secondary | ICD-10-CM | POA: Diagnosis not present

## 2021-08-15 DIAGNOSIS — E1122 Type 2 diabetes mellitus with diabetic chronic kidney disease: Secondary | ICD-10-CM | POA: Diagnosis not present

## 2021-08-15 DIAGNOSIS — N183 Chronic kidney disease, stage 3 unspecified: Secondary | ICD-10-CM | POA: Diagnosis not present

## 2021-08-15 DIAGNOSIS — E782 Mixed hyperlipidemia: Secondary | ICD-10-CM | POA: Diagnosis not present

## 2021-08-19 DIAGNOSIS — E1122 Type 2 diabetes mellitus with diabetic chronic kidney disease: Secondary | ICD-10-CM | POA: Diagnosis not present

## 2021-08-19 DIAGNOSIS — E538 Deficiency of other specified B group vitamins: Secondary | ICD-10-CM | POA: Diagnosis not present

## 2021-08-19 DIAGNOSIS — E1129 Type 2 diabetes mellitus with other diabetic kidney complication: Secondary | ICD-10-CM | POA: Diagnosis not present

## 2021-08-19 DIAGNOSIS — I10 Essential (primary) hypertension: Secondary | ICD-10-CM | POA: Diagnosis not present

## 2021-08-19 DIAGNOSIS — E1142 Type 2 diabetes mellitus with diabetic polyneuropathy: Secondary | ICD-10-CM | POA: Diagnosis not present

## 2021-08-19 DIAGNOSIS — I2589 Other forms of chronic ischemic heart disease: Secondary | ICD-10-CM | POA: Diagnosis not present

## 2021-08-19 DIAGNOSIS — E1165 Type 2 diabetes mellitus with hyperglycemia: Secondary | ICD-10-CM | POA: Diagnosis not present

## 2021-08-19 DIAGNOSIS — E1159 Type 2 diabetes mellitus with other circulatory complications: Secondary | ICD-10-CM | POA: Diagnosis not present

## 2021-09-19 DIAGNOSIS — I10 Essential (primary) hypertension: Secondary | ICD-10-CM | POA: Diagnosis not present

## 2021-09-19 DIAGNOSIS — E1165 Type 2 diabetes mellitus with hyperglycemia: Secondary | ICD-10-CM | POA: Diagnosis not present

## 2021-09-19 DIAGNOSIS — E7849 Other hyperlipidemia: Secondary | ICD-10-CM | POA: Diagnosis not present

## 2021-10-19 DIAGNOSIS — E7849 Other hyperlipidemia: Secondary | ICD-10-CM | POA: Diagnosis not present

## 2021-10-19 DIAGNOSIS — E1165 Type 2 diabetes mellitus with hyperglycemia: Secondary | ICD-10-CM | POA: Diagnosis not present

## 2021-10-19 DIAGNOSIS — I10 Essential (primary) hypertension: Secondary | ICD-10-CM | POA: Diagnosis not present

## 2021-10-19 DIAGNOSIS — Z7984 Long term (current) use of oral hypoglycemic drugs: Secondary | ICD-10-CM | POA: Diagnosis not present

## 2021-11-19 DIAGNOSIS — E7849 Other hyperlipidemia: Secondary | ICD-10-CM | POA: Diagnosis not present

## 2021-11-19 DIAGNOSIS — I10 Essential (primary) hypertension: Secondary | ICD-10-CM | POA: Diagnosis not present

## 2021-11-19 DIAGNOSIS — Z7984 Long term (current) use of oral hypoglycemic drugs: Secondary | ICD-10-CM | POA: Diagnosis not present

## 2021-11-19 DIAGNOSIS — E1165 Type 2 diabetes mellitus with hyperglycemia: Secondary | ICD-10-CM | POA: Diagnosis not present

## 2021-12-11 DIAGNOSIS — E782 Mixed hyperlipidemia: Secondary | ICD-10-CM | POA: Diagnosis not present

## 2021-12-11 DIAGNOSIS — E1165 Type 2 diabetes mellitus with hyperglycemia: Secondary | ICD-10-CM | POA: Diagnosis not present

## 2021-12-11 DIAGNOSIS — N183 Chronic kidney disease, stage 3 unspecified: Secondary | ICD-10-CM | POA: Diagnosis not present

## 2021-12-11 DIAGNOSIS — I10 Essential (primary) hypertension: Secondary | ICD-10-CM | POA: Diagnosis not present

## 2021-12-11 DIAGNOSIS — R5383 Other fatigue: Secondary | ICD-10-CM | POA: Diagnosis not present

## 2021-12-11 DIAGNOSIS — E7849 Other hyperlipidemia: Secondary | ICD-10-CM | POA: Diagnosis not present

## 2021-12-11 DIAGNOSIS — R5382 Chronic fatigue, unspecified: Secondary | ICD-10-CM | POA: Diagnosis not present

## 2021-12-11 DIAGNOSIS — D519 Vitamin B12 deficiency anemia, unspecified: Secondary | ICD-10-CM | POA: Diagnosis not present

## 2021-12-17 DIAGNOSIS — E7849 Other hyperlipidemia: Secondary | ICD-10-CM | POA: Diagnosis not present

## 2021-12-17 DIAGNOSIS — E1142 Type 2 diabetes mellitus with diabetic polyneuropathy: Secondary | ICD-10-CM | POA: Diagnosis not present

## 2021-12-17 DIAGNOSIS — R5383 Other fatigue: Secondary | ICD-10-CM | POA: Diagnosis not present

## 2021-12-17 DIAGNOSIS — R609 Edema, unspecified: Secondary | ICD-10-CM | POA: Diagnosis not present

## 2021-12-17 DIAGNOSIS — I10 Essential (primary) hypertension: Secondary | ICD-10-CM | POA: Diagnosis not present

## 2021-12-17 DIAGNOSIS — E1129 Type 2 diabetes mellitus with other diabetic kidney complication: Secondary | ICD-10-CM | POA: Diagnosis not present

## 2021-12-17 DIAGNOSIS — E1159 Type 2 diabetes mellitus with other circulatory complications: Secondary | ICD-10-CM | POA: Diagnosis not present

## 2021-12-17 DIAGNOSIS — I2589 Other forms of chronic ischemic heart disease: Secondary | ICD-10-CM | POA: Diagnosis not present

## 2021-12-17 DIAGNOSIS — E1165 Type 2 diabetes mellitus with hyperglycemia: Secondary | ICD-10-CM | POA: Diagnosis not present

## 2021-12-17 DIAGNOSIS — E538 Deficiency of other specified B group vitamins: Secondary | ICD-10-CM | POA: Diagnosis not present

## 2021-12-17 DIAGNOSIS — E1122 Type 2 diabetes mellitus with diabetic chronic kidney disease: Secondary | ICD-10-CM | POA: Diagnosis not present

## 2021-12-19 DIAGNOSIS — Z7984 Long term (current) use of oral hypoglycemic drugs: Secondary | ICD-10-CM | POA: Diagnosis not present

## 2021-12-19 DIAGNOSIS — E7849 Other hyperlipidemia: Secondary | ICD-10-CM | POA: Diagnosis not present

## 2021-12-19 DIAGNOSIS — E1165 Type 2 diabetes mellitus with hyperglycemia: Secondary | ICD-10-CM | POA: Diagnosis not present

## 2022-01-19 DIAGNOSIS — E7849 Other hyperlipidemia: Secondary | ICD-10-CM | POA: Diagnosis not present

## 2022-01-19 DIAGNOSIS — Z7984 Long term (current) use of oral hypoglycemic drugs: Secondary | ICD-10-CM | POA: Diagnosis not present

## 2022-01-19 DIAGNOSIS — E1165 Type 2 diabetes mellitus with hyperglycemia: Secondary | ICD-10-CM | POA: Diagnosis not present

## 2022-01-19 DIAGNOSIS — I10 Essential (primary) hypertension: Secondary | ICD-10-CM | POA: Diagnosis not present

## 2022-03-26 DIAGNOSIS — H903 Sensorineural hearing loss, bilateral: Secondary | ICD-10-CM | POA: Diagnosis not present

## 2022-04-15 DIAGNOSIS — E1165 Type 2 diabetes mellitus with hyperglycemia: Secondary | ICD-10-CM | POA: Diagnosis not present

## 2022-04-15 DIAGNOSIS — I10 Essential (primary) hypertension: Secondary | ICD-10-CM | POA: Diagnosis not present

## 2022-04-15 DIAGNOSIS — R5383 Other fatigue: Secondary | ICD-10-CM | POA: Diagnosis not present

## 2022-04-15 DIAGNOSIS — E7849 Other hyperlipidemia: Secondary | ICD-10-CM | POA: Diagnosis not present

## 2022-04-15 DIAGNOSIS — N183 Chronic kidney disease, stage 3 unspecified: Secondary | ICD-10-CM | POA: Diagnosis not present

## 2022-04-15 DIAGNOSIS — D519 Vitamin B12 deficiency anemia, unspecified: Secondary | ICD-10-CM | POA: Diagnosis not present

## 2022-04-21 DIAGNOSIS — E1129 Type 2 diabetes mellitus with other diabetic kidney complication: Secondary | ICD-10-CM | POA: Diagnosis not present

## 2022-04-21 DIAGNOSIS — Z1331 Encounter for screening for depression: Secondary | ICD-10-CM | POA: Diagnosis not present

## 2022-04-21 DIAGNOSIS — E1142 Type 2 diabetes mellitus with diabetic polyneuropathy: Secondary | ICD-10-CM | POA: Diagnosis not present

## 2022-04-21 DIAGNOSIS — I10 Essential (primary) hypertension: Secondary | ICD-10-CM | POA: Diagnosis not present

## 2022-04-21 DIAGNOSIS — E1122 Type 2 diabetes mellitus with diabetic chronic kidney disease: Secondary | ICD-10-CM | POA: Diagnosis not present

## 2022-04-21 DIAGNOSIS — N183 Chronic kidney disease, stage 3 unspecified: Secondary | ICD-10-CM | POA: Diagnosis not present

## 2022-04-21 DIAGNOSIS — E1159 Type 2 diabetes mellitus with other circulatory complications: Secondary | ICD-10-CM | POA: Diagnosis not present

## 2022-04-21 DIAGNOSIS — E1165 Type 2 diabetes mellitus with hyperglycemia: Secondary | ICD-10-CM | POA: Diagnosis not present

## 2022-04-21 DIAGNOSIS — Z1389 Encounter for screening for other disorder: Secondary | ICD-10-CM | POA: Diagnosis not present

## 2022-04-21 DIAGNOSIS — Z23 Encounter for immunization: Secondary | ICD-10-CM | POA: Diagnosis not present

## 2022-04-21 DIAGNOSIS — I2589 Other forms of chronic ischemic heart disease: Secondary | ICD-10-CM | POA: Diagnosis not present

## 2022-04-21 DIAGNOSIS — R609 Edema, unspecified: Secondary | ICD-10-CM | POA: Diagnosis not present

## 2022-04-21 DIAGNOSIS — E7849 Other hyperlipidemia: Secondary | ICD-10-CM | POA: Diagnosis not present

## 2022-05-12 DIAGNOSIS — C44629 Squamous cell carcinoma of skin of left upper limb, including shoulder: Secondary | ICD-10-CM | POA: Diagnosis not present

## 2022-05-12 DIAGNOSIS — L578 Other skin changes due to chronic exposure to nonionizing radiation: Secondary | ICD-10-CM | POA: Diagnosis not present

## 2022-05-12 DIAGNOSIS — D485 Neoplasm of uncertain behavior of skin: Secondary | ICD-10-CM | POA: Diagnosis not present

## 2022-05-12 DIAGNOSIS — L570 Actinic keratosis: Secondary | ICD-10-CM | POA: Diagnosis not present

## 2022-05-12 DIAGNOSIS — L905 Scar conditions and fibrosis of skin: Secondary | ICD-10-CM | POA: Diagnosis not present

## 2022-05-12 DIAGNOSIS — L720 Epidermal cyst: Secondary | ICD-10-CM | POA: Diagnosis not present

## 2022-05-12 DIAGNOSIS — Z85828 Personal history of other malignant neoplasm of skin: Secondary | ICD-10-CM | POA: Diagnosis not present

## 2022-05-12 DIAGNOSIS — L821 Other seborrheic keratosis: Secondary | ICD-10-CM | POA: Diagnosis not present

## 2022-05-21 DIAGNOSIS — N183 Chronic kidney disease, stage 3 unspecified: Secondary | ICD-10-CM | POA: Diagnosis not present

## 2022-05-21 DIAGNOSIS — E1165 Type 2 diabetes mellitus with hyperglycemia: Secondary | ICD-10-CM | POA: Diagnosis not present

## 2022-05-21 DIAGNOSIS — I10 Essential (primary) hypertension: Secondary | ICD-10-CM | POA: Diagnosis not present

## 2022-05-26 DIAGNOSIS — C44629 Squamous cell carcinoma of skin of left upper limb, including shoulder: Secondary | ICD-10-CM | POA: Diagnosis not present

## 2022-05-26 DIAGNOSIS — L905 Scar conditions and fibrosis of skin: Secondary | ICD-10-CM | POA: Diagnosis not present

## 2022-06-21 DIAGNOSIS — E782 Mixed hyperlipidemia: Secondary | ICD-10-CM | POA: Diagnosis not present

## 2022-06-21 DIAGNOSIS — E1165 Type 2 diabetes mellitus with hyperglycemia: Secondary | ICD-10-CM | POA: Diagnosis not present

## 2022-06-21 DIAGNOSIS — I10 Essential (primary) hypertension: Secondary | ICD-10-CM | POA: Diagnosis not present

## 2022-06-21 DIAGNOSIS — N1831 Chronic kidney disease, stage 3a: Secondary | ICD-10-CM | POA: Diagnosis not present

## 2022-08-07 ENCOUNTER — Ambulatory Visit: Payer: Medicare Other | Attending: Internal Medicine | Admitting: Internal Medicine

## 2022-08-07 ENCOUNTER — Encounter: Payer: Self-pay | Admitting: Internal Medicine

## 2022-08-07 VITALS — BP 140/80 | HR 68 | Ht 72.0 in | Wt 217.4 lb

## 2022-08-07 DIAGNOSIS — I25810 Atherosclerosis of coronary artery bypass graft(s) without angina pectoris: Secondary | ICD-10-CM | POA: Diagnosis not present

## 2022-08-07 MED ORDER — ROSUVASTATIN CALCIUM 20 MG PO TABS: 20.0000 mg | ORAL_TABLET | Freq: Every day | ORAL | 3 refills | Status: AC

## 2022-08-07 NOTE — Progress Notes (Signed)
Cardiology Office Note  Date: 08/07/2022   ID: Colin, Aguirre [birth year], MRN [id number]  PCP:  Colin Hilding, MD  Cardiologist:  Chalmers Guest, MD Electrophysiologist:  None   Reason for Office Visit: Follow-up visit of CAD   History of Present Illness: Colin Aguirre is a 77 y.o. male known to have CAD s/p CABG in 2002, s/p PCI to mid SVG to OM and PCI to LCx in 2015, HTN, DM 2, HLD presented to cardiology clinic for follow-up visit.  Patient has been having indigestion/heartburn symptoms in the last 6 months that is relieved with antacids. He started to have left arm pain that gets better and worse with left arm movements after he picked up the trash can a few months ago.  Denies any symptoms of SOB, dizziness, lightness, syncope and leg swelling.  Past Medical History:  Diagnosis Date   CAD (coronary artery disease)    H/O MI AND CABG   Diabetes mellitus ([place])    Hyperlipidemia    Hypertension     Past Surgical History:  Procedure Laterality Date   CORONARY ARTERY BYPASS GRAFT     LEFT HEART CATHETERIZATION WITH CORONARY/GRAFT ANGIOGRAM N/A 10/19/2013   Procedure: LEFT HEART CATHETERIZATION WITH Colin Aguirre;  Surgeon: Colin Blanks, MD;  Location: [facility] CATH LAB;  Service: Cardiovascular;  Laterality: N/A;   PERCUTANEOUS CORONARY STENT INTERVENTION (PCI-S)  10/19/2013   Procedure: PERCUTANEOUS CORONARY STENT INTERVENTION (PCI-S);  Surgeon: Colin Blanks, MD;  Location: [facility] CATH LAB;  Service: Cardiovascular;;    Current Outpatient Medications  Medication Sig Dispense Refill   aspirin EC 81 MG tablet Take 81 mg by mouth daily.     dapagliflozin propanediol (FARXIGA) 10 MG TABS tablet Take 10 mg by mouth daily.     hydrochlorothiazide (HYDRODIURIL) 25 MG tablet Take 25 mg by mouth daily.     metFORMIN (GLUCOPHAGE) 850 MG tablet Take 1 tablet by mouth 3 (three) times daily.     metoprolol (LOPRESSOR) 50 MG tablet Take 25 mg  by mouth 2 (two) times daily.     nitroGLYCERIN (NITROSTAT) 0.4 MG SL tablet Place 1 tablet (0.4 mg total) under the tongue every 5 (five) minutes as needed for chest pain. 25 tablet 3   Omega-3 Fatty Acids (FISH OIL) 1200 MG CAPS Take 1,400 mg by mouth daily.      ramipril (ALTACE) 10 MG capsule Take 10 mg by mouth daily.     rosuvastatin (CRESTOR) 20 MG tablet Take 1 tablet (20 mg total) by mouth at bedtime. 90 tablet 3   Semaglutide,0.25 or 0.5MG/DOS, (OZEMPIC, 0.25 OR 0.5 MG/DOSE,) 2 MG/1.5ML SOPN Inject 1 mg into the skin once a week.     No current facility-administered medications for this visit.   Allergies:  Patient has no known allergies.   Social History: The patient  reports that he quit smoking about 22 years ago. His smoking use included cigarettes. He started smoking about 61 years ago. He has a 44.00 pack-year smoking history. His smokeless tobacco use includes chew.   Family History: The patient's family history includes Breast cancer in his mother; Heart disease in his father.   ROS:  Please see the history of present illness. Otherwise, complete review of systems is positive for none.  All other systems are reviewed and negative.   Physical Exam: VS:  BP (!) 140/80   Pulse 68   Ht 6' (1.829 m)   Wt 217 lb 6.4 oz (  98.6 kg)   SpO2 97%   BMI 29.48 kg/m , BMI Body mass index is 29.48 kg/m.  Wt Readings from Last 3 Encounters:  08/07/22 217 lb 6.4 oz (98.6 kg)  08/07/20 242 lb 9.6 oz (110 kg)  06/29/19 270 lb (122.5 kg)    General: Patient appears comfortable at rest. HEENT: Conjunctiva and lids normal, oropharynx clear with moist mucosa. Neck: Supple, no elevated JVP or carotid bruits, no thyromegaly. Lungs: Clear to auscultation, nonlabored breathing at rest. Cardiac: Regular rate and rhythm, no S3 or significant systolic murmur, no pericardial rub. Abdomen: Soft, nontender, no hepatomegaly, bowel sounds present, no guarding or rebound. Extremities: No pitting  edema, distal pulses 2+. Skin: Warm and dry. Musculoskeletal: No kyphosis. Neuropsychiatric: Alert and oriented x3, affect grossly appropriate.  ECG: Normal sinus rhythm  Recent Labwork: No results found for requested labs within last 365 days.  No results found for: "CHOL", "TRIG", "HDL", "CHOLHDL", "VLDL", "LDLCALC", "LDLDIRECT"  Other Studies Reviewed Today: Echocardiogram from 06/2018 LVEF 50 to 55% G1 DD No valve abnormalities  Assessment and Plan: Patient is a 77 year old M known to have CAD s/p CABG in 2002, s/p PCI to mid SVG to OM and PCI to LCx in 2015, HTN, DM 2, HLD presented to cardiology clinic for follow-up visit.  # CAD s/p CABG in 2002, s/p PCI to mid SVG to OM and PCI to LCx in 2015, currently angina free -Continue aspirin 81 mg once daily -Switch simvastatin to rosuvastatin 20 mg nightly -Continue metoprolol tartrate 25 mg twice daily -Continue ramipril 10 mg once daily -SLE NTG 0.4 mg. -ER precautions for chest pain  # HTN, controlled -Continue HCTZ 25 mg once daily, metoprolol tartrate 25 mg twice daily and ramipril 10 mg once daily.  # HLD, unknown values -Switch simvastatin to rosuvastatin 20 mg nightly.  Goal LDL less than 70.  Request lipid panel from PCP.  I have spent a total of 30 minutes with patient reviewing chart, EKGs, labs and examining patient as well as establishing an assessment and plan that was discussed with the patient.  > 50% of time was spent in direct patient care.      Medication Adjustments/Labs and Tests Ordered: Current medicines are reviewed at length with the patient today.  Concerns regarding medicines are outlined above.   Tests Ordered: Orders Placed This Encounter  Procedures   EKG 12-Lead    Medication Changes: Meds ordered this encounter  Medications   rosuvastatin (CRESTOR) 20 MG tablet    Sig: Take 1 tablet (20 mg total) by mouth at bedtime.    Dispense:  90 tablet    Refill:  3    08/07/2022 NEW-stop  simvastatin    Disposition:  Follow up  1 year  Signed Colin Zeoli Fidel Levy, MD, 08/07/2022 12:38 PM    [facility] [place], [place], [place] [address]

## 2022-08-07 NOTE — Patient Instructions (Addendum)
Medication Instructions:  Your physician has recommended you make the following change in your medication:  Stop simvastatin Start rosuvastatin 20 mg daily at bedtime Continue other medications the same  Labwork: none  Testing/Procedures: none  Follow-Up: Your physician recommends that you schedule a follow-up appointment in: 1 year. You will receive a reminder call in the mail in about 10 months reminding you to call and schedule your appointment. If you don't receive this call, please contact our office.  Any Other Special Instructions Will Be Listed Below (If Applicable).  If you need a refill on your cardiac medications before your next appointment, please call your pharmacy.

## 2022-08-11 DIAGNOSIS — L820 Inflamed seborrheic keratosis: Secondary | ICD-10-CM | POA: Diagnosis not present

## 2022-08-11 DIAGNOSIS — D692 Other nonthrombocytopenic purpura: Secondary | ICD-10-CM | POA: Diagnosis not present

## 2022-08-11 DIAGNOSIS — Z85828 Personal history of other malignant neoplasm of skin: Secondary | ICD-10-CM | POA: Diagnosis not present

## 2022-08-11 DIAGNOSIS — L578 Other skin changes due to chronic exposure to nonionizing radiation: Secondary | ICD-10-CM | POA: Diagnosis not present

## 2022-08-11 DIAGNOSIS — L905 Scar conditions and fibrosis of skin: Secondary | ICD-10-CM | POA: Diagnosis not present

## 2022-08-11 DIAGNOSIS — L259 Unspecified contact dermatitis, unspecified cause: Secondary | ICD-10-CM | POA: Diagnosis not present

## 2022-08-11 DIAGNOSIS — L570 Actinic keratosis: Secondary | ICD-10-CM | POA: Diagnosis not present

## 2022-08-11 DIAGNOSIS — L821 Other seborrheic keratosis: Secondary | ICD-10-CM | POA: Diagnosis not present

## 2022-08-12 DIAGNOSIS — H903 Sensorineural hearing loss, bilateral: Secondary | ICD-10-CM | POA: Diagnosis not present

## 2022-08-12 DIAGNOSIS — Z974 Presence of external hearing-aid: Secondary | ICD-10-CM | POA: Diagnosis not present

## 2022-08-12 DIAGNOSIS — H6123 Impacted cerumen, bilateral: Secondary | ICD-10-CM | POA: Diagnosis not present

## 2022-08-17 DIAGNOSIS — E1159 Type 2 diabetes mellitus with other circulatory complications: Secondary | ICD-10-CM | POA: Diagnosis not present

## 2022-08-17 DIAGNOSIS — N183 Chronic kidney disease, stage 3 unspecified: Secondary | ICD-10-CM | POA: Diagnosis not present

## 2022-08-17 DIAGNOSIS — I10 Essential (primary) hypertension: Secondary | ICD-10-CM | POA: Diagnosis not present

## 2022-08-17 DIAGNOSIS — E7849 Other hyperlipidemia: Secondary | ICD-10-CM | POA: Diagnosis not present

## 2022-08-17 DIAGNOSIS — E1142 Type 2 diabetes mellitus with diabetic polyneuropathy: Secondary | ICD-10-CM | POA: Diagnosis not present

## 2022-08-17 DIAGNOSIS — E1122 Type 2 diabetes mellitus with diabetic chronic kidney disease: Secondary | ICD-10-CM | POA: Diagnosis not present

## 2022-08-17 DIAGNOSIS — E039 Hypothyroidism, unspecified: Secondary | ICD-10-CM | POA: Diagnosis not present

## 2022-09-02 DIAGNOSIS — E538 Deficiency of other specified B group vitamins: Secondary | ICD-10-CM | POA: Diagnosis not present

## 2022-09-02 DIAGNOSIS — R609 Edema, unspecified: Secondary | ICD-10-CM | POA: Diagnosis not present

## 2022-09-02 DIAGNOSIS — E1159 Type 2 diabetes mellitus with other circulatory complications: Secondary | ICD-10-CM | POA: Diagnosis not present

## 2022-09-02 DIAGNOSIS — E1129 Type 2 diabetes mellitus with other diabetic kidney complication: Secondary | ICD-10-CM | POA: Diagnosis not present

## 2022-09-02 DIAGNOSIS — I10 Essential (primary) hypertension: Secondary | ICD-10-CM | POA: Diagnosis not present

## 2022-09-02 DIAGNOSIS — E1122 Type 2 diabetes mellitus with diabetic chronic kidney disease: Secondary | ICD-10-CM | POA: Diagnosis not present

## 2022-09-02 DIAGNOSIS — E7849 Other hyperlipidemia: Secondary | ICD-10-CM | POA: Diagnosis not present

## 2022-09-02 DIAGNOSIS — E1165 Type 2 diabetes mellitus with hyperglycemia: Secondary | ICD-10-CM | POA: Diagnosis not present

## 2022-09-02 DIAGNOSIS — I2589 Other forms of chronic ischemic heart disease: Secondary | ICD-10-CM | POA: Diagnosis not present

## 2022-09-02 DIAGNOSIS — R5383 Other fatigue: Secondary | ICD-10-CM | POA: Diagnosis not present

## 2022-09-02 DIAGNOSIS — Z23 Encounter for immunization: Secondary | ICD-10-CM | POA: Diagnosis not present

## 2022-09-02 DIAGNOSIS — E1142 Type 2 diabetes mellitus with diabetic polyneuropathy: Secondary | ICD-10-CM | POA: Diagnosis not present

## 2022-10-20 DIAGNOSIS — N1831 Chronic kidney disease, stage 3a: Secondary | ICD-10-CM | POA: Diagnosis not present

## 2022-10-20 DIAGNOSIS — I10 Essential (primary) hypertension: Secondary | ICD-10-CM | POA: Diagnosis not present

## 2022-10-20 DIAGNOSIS — E782 Mixed hyperlipidemia: Secondary | ICD-10-CM | POA: Diagnosis not present

## 2022-10-20 DIAGNOSIS — E1165 Type 2 diabetes mellitus with hyperglycemia: Secondary | ICD-10-CM | POA: Diagnosis not present

## 2022-12-16 DIAGNOSIS — N1831 Chronic kidney disease, stage 3a: Secondary | ICD-10-CM | POA: Diagnosis not present

## 2022-12-16 DIAGNOSIS — E7849 Other hyperlipidemia: Secondary | ICD-10-CM | POA: Diagnosis not present

## 2022-12-16 DIAGNOSIS — E782 Mixed hyperlipidemia: Secondary | ICD-10-CM | POA: Diagnosis not present

## 2022-12-16 DIAGNOSIS — Z1329 Encounter for screening for other suspected endocrine disorder: Secondary | ICD-10-CM | POA: Diagnosis not present

## 2022-12-16 DIAGNOSIS — E538 Deficiency of other specified B group vitamins: Secondary | ICD-10-CM | POA: Diagnosis not present

## 2022-12-16 DIAGNOSIS — E1165 Type 2 diabetes mellitus with hyperglycemia: Secondary | ICD-10-CM | POA: Diagnosis not present

## 2022-12-23 DIAGNOSIS — E1142 Type 2 diabetes mellitus with diabetic polyneuropathy: Secondary | ICD-10-CM | POA: Diagnosis not present

## 2022-12-23 DIAGNOSIS — R609 Edema, unspecified: Secondary | ICD-10-CM | POA: Diagnosis not present

## 2022-12-23 DIAGNOSIS — Z0001 Encounter for general adult medical examination with abnormal findings: Secondary | ICD-10-CM | POA: Diagnosis not present

## 2022-12-23 DIAGNOSIS — E1165 Type 2 diabetes mellitus with hyperglycemia: Secondary | ICD-10-CM | POA: Diagnosis not present

## 2022-12-23 DIAGNOSIS — I10 Essential (primary) hypertension: Secondary | ICD-10-CM | POA: Diagnosis not present

## 2022-12-23 DIAGNOSIS — Z23 Encounter for immunization: Secondary | ICD-10-CM | POA: Diagnosis not present

## 2022-12-23 DIAGNOSIS — E1159 Type 2 diabetes mellitus with other circulatory complications: Secondary | ICD-10-CM | POA: Diagnosis not present

## 2022-12-23 DIAGNOSIS — E1129 Type 2 diabetes mellitus with other diabetic kidney complication: Secondary | ICD-10-CM | POA: Diagnosis not present

## 2022-12-23 DIAGNOSIS — E1122 Type 2 diabetes mellitus with diabetic chronic kidney disease: Secondary | ICD-10-CM | POA: Diagnosis not present

## 2022-12-23 DIAGNOSIS — E538 Deficiency of other specified B group vitamins: Secondary | ICD-10-CM | POA: Diagnosis not present

## 2022-12-23 DIAGNOSIS — I2589 Other forms of chronic ischemic heart disease: Secondary | ICD-10-CM | POA: Diagnosis not present

## 2022-12-23 DIAGNOSIS — E7849 Other hyperlipidemia: Secondary | ICD-10-CM | POA: Diagnosis not present

## 2023-02-09 DIAGNOSIS — L219 Seborrheic dermatitis, unspecified: Secondary | ICD-10-CM | POA: Diagnosis not present

## 2023-02-09 DIAGNOSIS — Z08 Encounter for follow-up examination after completed treatment for malignant neoplasm: Secondary | ICD-10-CM | POA: Diagnosis not present

## 2023-02-09 DIAGNOSIS — L259 Unspecified contact dermatitis, unspecified cause: Secondary | ICD-10-CM | POA: Diagnosis not present

## 2023-02-09 DIAGNOSIS — L570 Actinic keratosis: Secondary | ICD-10-CM | POA: Diagnosis not present

## 2023-02-09 DIAGNOSIS — Z85828 Personal history of other malignant neoplasm of skin: Secondary | ICD-10-CM | POA: Diagnosis not present

## 2023-02-09 DIAGNOSIS — L905 Scar conditions and fibrosis of skin: Secondary | ICD-10-CM | POA: Diagnosis not present

## 2023-02-09 DIAGNOSIS — L578 Other skin changes due to chronic exposure to nonionizing radiation: Secondary | ICD-10-CM | POA: Diagnosis not present

## 2023-02-09 DIAGNOSIS — L821 Other seborrheic keratosis: Secondary | ICD-10-CM | POA: Diagnosis not present

## 2023-02-10 DIAGNOSIS — H6123 Impacted cerumen, bilateral: Secondary | ICD-10-CM | POA: Diagnosis not present

## 2023-02-10 DIAGNOSIS — H903 Sensorineural hearing loss, bilateral: Secondary | ICD-10-CM | POA: Diagnosis not present

## 2023-02-10 DIAGNOSIS — Z974 Presence of external hearing-aid: Secondary | ICD-10-CM | POA: Diagnosis not present

## 2023-04-19 DIAGNOSIS — N183 Chronic kidney disease, stage 3 unspecified: Secondary | ICD-10-CM | POA: Diagnosis not present

## 2023-04-19 DIAGNOSIS — E7849 Other hyperlipidemia: Secondary | ICD-10-CM | POA: Diagnosis not present

## 2023-04-19 DIAGNOSIS — R5383 Other fatigue: Secondary | ICD-10-CM | POA: Diagnosis not present

## 2023-04-19 DIAGNOSIS — E1122 Type 2 diabetes mellitus with diabetic chronic kidney disease: Secondary | ICD-10-CM | POA: Diagnosis not present

## 2023-04-26 DIAGNOSIS — E538 Deficiency of other specified B group vitamins: Secondary | ICD-10-CM | POA: Diagnosis not present

## 2023-04-26 DIAGNOSIS — R5383 Other fatigue: Secondary | ICD-10-CM | POA: Diagnosis not present

## 2023-04-26 DIAGNOSIS — E1165 Type 2 diabetes mellitus with hyperglycemia: Secondary | ICD-10-CM | POA: Diagnosis not present

## 2023-04-26 DIAGNOSIS — E1122 Type 2 diabetes mellitus with diabetic chronic kidney disease: Secondary | ICD-10-CM | POA: Diagnosis not present

## 2023-04-26 DIAGNOSIS — E1159 Type 2 diabetes mellitus with other circulatory complications: Secondary | ICD-10-CM | POA: Diagnosis not present

## 2023-04-26 DIAGNOSIS — E1129 Type 2 diabetes mellitus with other diabetic kidney complication: Secondary | ICD-10-CM | POA: Diagnosis not present

## 2023-04-26 DIAGNOSIS — I2589 Other forms of chronic ischemic heart disease: Secondary | ICD-10-CM | POA: Diagnosis not present

## 2023-04-26 DIAGNOSIS — E1142 Type 2 diabetes mellitus with diabetic polyneuropathy: Secondary | ICD-10-CM | POA: Diagnosis not present

## 2023-04-26 DIAGNOSIS — E7849 Other hyperlipidemia: Secondary | ICD-10-CM | POA: Diagnosis not present

## 2023-04-26 DIAGNOSIS — R609 Edema, unspecified: Secondary | ICD-10-CM | POA: Diagnosis not present

## 2023-04-26 DIAGNOSIS — Z23 Encounter for immunization: Secondary | ICD-10-CM | POA: Diagnosis not present

## 2023-04-26 DIAGNOSIS — I10 Essential (primary) hypertension: Secondary | ICD-10-CM | POA: Diagnosis not present

## 2023-05-21 DIAGNOSIS — E782 Mixed hyperlipidemia: Secondary | ICD-10-CM | POA: Diagnosis not present

## 2023-05-21 DIAGNOSIS — E1122 Type 2 diabetes mellitus with diabetic chronic kidney disease: Secondary | ICD-10-CM | POA: Diagnosis not present

## 2023-07-23 DIAGNOSIS — I10 Essential (primary) hypertension: Secondary | ICD-10-CM | POA: Diagnosis not present

## 2023-07-23 DIAGNOSIS — E782 Mixed hyperlipidemia: Secondary | ICD-10-CM | POA: Diagnosis not present

## 2023-07-23 DIAGNOSIS — E1122 Type 2 diabetes mellitus with diabetic chronic kidney disease: Secondary | ICD-10-CM | POA: Diagnosis not present

## 2023-08-19 DIAGNOSIS — I10 Essential (primary) hypertension: Secondary | ICD-10-CM | POA: Diagnosis not present

## 2023-08-19 DIAGNOSIS — E782 Mixed hyperlipidemia: Secondary | ICD-10-CM | POA: Diagnosis not present

## 2023-08-19 DIAGNOSIS — E1122 Type 2 diabetes mellitus with diabetic chronic kidney disease: Secondary | ICD-10-CM | POA: Diagnosis not present

## 2023-08-20 DIAGNOSIS — E7849 Other hyperlipidemia: Secondary | ICD-10-CM | POA: Diagnosis not present

## 2023-08-20 DIAGNOSIS — N1831 Chronic kidney disease, stage 3a: Secondary | ICD-10-CM | POA: Diagnosis not present

## 2023-08-20 DIAGNOSIS — I10 Essential (primary) hypertension: Secondary | ICD-10-CM | POA: Diagnosis not present

## 2023-08-20 DIAGNOSIS — E1165 Type 2 diabetes mellitus with hyperglycemia: Secondary | ICD-10-CM | POA: Diagnosis not present

## 2023-08-26 DIAGNOSIS — I10 Essential (primary) hypertension: Secondary | ICD-10-CM | POA: Diagnosis not present

## 2023-08-26 DIAGNOSIS — E782 Mixed hyperlipidemia: Secondary | ICD-10-CM | POA: Diagnosis not present

## 2023-08-26 DIAGNOSIS — Z6829 Body mass index (BMI) 29.0-29.9, adult: Secondary | ICD-10-CM | POA: Diagnosis not present

## 2023-08-26 DIAGNOSIS — E1165 Type 2 diabetes mellitus with hyperglycemia: Secondary | ICD-10-CM | POA: Diagnosis not present

## 2023-08-26 DIAGNOSIS — N1831 Chronic kidney disease, stage 3a: Secondary | ICD-10-CM | POA: Diagnosis not present

## 2023-09-20 DIAGNOSIS — I10 Essential (primary) hypertension: Secondary | ICD-10-CM | POA: Diagnosis not present

## 2023-09-20 DIAGNOSIS — E782 Mixed hyperlipidemia: Secondary | ICD-10-CM | POA: Diagnosis not present

## 2023-09-20 DIAGNOSIS — E1122 Type 2 diabetes mellitus with diabetic chronic kidney disease: Secondary | ICD-10-CM | POA: Diagnosis not present

## 2023-09-30 DIAGNOSIS — H25813 Combined forms of age-related cataract, bilateral: Secondary | ICD-10-CM | POA: Diagnosis not present

## 2023-09-30 DIAGNOSIS — E1165 Type 2 diabetes mellitus with hyperglycemia: Secondary | ICD-10-CM | POA: Diagnosis not present

## 2023-10-20 DIAGNOSIS — E1122 Type 2 diabetes mellitus with diabetic chronic kidney disease: Secondary | ICD-10-CM | POA: Diagnosis not present

## 2023-10-20 DIAGNOSIS — E782 Mixed hyperlipidemia: Secondary | ICD-10-CM | POA: Diagnosis not present

## 2023-10-20 DIAGNOSIS — I10 Essential (primary) hypertension: Secondary | ICD-10-CM | POA: Diagnosis not present

## 2023-11-09 ENCOUNTER — Encounter: Payer: Self-pay | Admitting: Internal Medicine

## 2023-11-09 ENCOUNTER — Ambulatory Visit: Attending: Internal Medicine | Admitting: Internal Medicine

## 2023-11-09 VITALS — BP 128/78 | HR 59 | Ht 72.0 in | Wt 220.4 lb

## 2023-11-09 DIAGNOSIS — I25810 Atherosclerosis of coronary artery bypass graft(s) without angina pectoris: Secondary | ICD-10-CM | POA: Diagnosis not present

## 2023-11-09 NOTE — Patient Instructions (Signed)

## 2023-11-09 NOTE — Progress Notes (Signed)
 Cardiology Office Note  Date: 11/09/2023   ID: Ryman, Rathgeber 1945-10-27, MRN [id number]  PCP:  Orest Bio, MD  Cardiologist:  Lasalle Pointer, MD Electrophysiologist:  None   Reason for Office Visit: Follow-up visit of CAD   History of Present Illness: Colin Aguirre is a 78 y.o. male known to have CAD s/p CABG in 2002, s/p PCI to mid SVG to OM and PCI to LCx in 2015, HTN, DM 2, HLD presented to cardiology clinic for follow-up visit.  No angina, DOE.  No dizziness, palpitations, leg swelling.  Compliant with medications and has no side effects.  He was  diagnosed with DM 2 according to him and was started on insulin recently around 2 months ago.  He lost around 50 pounds of weight on Ozempic.  On Farxiga, no recent infections.  Past Medical History:  Diagnosis Date   CAD (coronary artery disease)    H/O MI AND CABG   Diabetes mellitus (HCC)    Hyperlipidemia    Hypertension     Past Surgical History:  Procedure Laterality Date   CORONARY ARTERY BYPASS GRAFT     LEFT HEART CATHETERIZATION WITH CORONARY/GRAFT ANGIOGRAM N/A 10/19/2013   Procedure: LEFT HEART CATHETERIZATION WITH Estella Helling;  Surgeon: Odie Benne, MD;  Location: [facility] CATH LAB;  Service: Cardiovascular;  Laterality: N/A;   PERCUTANEOUS CORONARY STENT INTERVENTION (PCI-S)  10/19/2013   Procedure: PERCUTANEOUS CORONARY STENT INTERVENTION (PCI-S);  Surgeon: Odie Benne, MD;  Location: [facility] CATH LAB;  Service: Cardiovascular;;    Current Outpatient Medications  Medication Sig Dispense Refill   aspirin  EC 81 MG tablet Take 81 mg by mouth daily.     dapagliflozin propanediol (FARXIGA) 10 MG TABS tablet Take 10 mg by mouth daily.     hydrochlorothiazide  (HYDRODIURIL ) 25 MG tablet Take 25 mg by mouth daily.     metFORMIN  (GLUCOPHAGE ) 850 MG tablet Take 1 tablet by mouth 3 (three) times daily.     metoprolol  (LOPRESSOR ) 50 MG tablet Take 25 mg by mouth 2 (two) times  daily.     nitroGLYCERIN  (NITROSTAT ) 0.4 MG SL tablet Place 1 tablet (0.4 mg total) under the tongue every 5 (five) minutes as needed for chest pain. 25 tablet 3   Omega-3 Fatty Acids (FISH OIL) 1200 MG CAPS Take 1,400 mg by mouth daily.      ramipril  (ALTACE ) 10 MG capsule Take 10 mg by mouth daily.     rosuvastatin  (CRESTOR ) 20 MG tablet Take 1 tablet (20 mg total) by mouth at bedtime. 90 tablet 3   Semaglutide,0.25 or 0.5MG /DOS, (OZEMPIC, 0.25 OR 0.5 MG/DOSE,) 2 MG/1.5ML SOPN Inject 1 mg into the skin once a week.     TRESIBA FLEXTOUCH 200 UNIT/ML FlexTouch Pen Inject 12 Units into the skin every evening.     No current facility-administered medications for this visit.   Allergies:  Patient has no known allergies.   Social History: The patient  reports that he quit smoking about 23 years ago. His smoking use included cigarettes. He started smoking about 62 years ago. He has a 44 pack-year smoking history. His smokeless tobacco use includes chew.   Family History: The patient's family history includes Breast cancer in his mother; Heart disease in his father.   ROS:  Please see the history of present illness. Otherwise, complete review of systems is positive for none.  All other systems are reviewed and negative.   Physical Exam: VS:  BP 128/78  Pulse (!) 59   Ht 6' (1.829 m)   Wt 220 lb 6.4 oz (100 kg)   SpO2 96%   BMI 29.89 kg/m , BMI Body mass index is 29.89 kg/m.  Wt Readings from Last 3 Encounters:  11/09/23 220 lb 6.4 oz (100 kg)  08/07/22 217 lb 6.4 oz (98.6 kg)  08/07/20 242 lb 9.6 oz (110 kg)    General: Patient appears comfortable at rest. HEENT: Conjunctiva and lids normal, oropharynx clear with moist mucosa. Neck: Supple, no elevated JVP or carotid bruits, no thyromegaly. Lungs: Clear to auscultation, nonlabored breathing at rest. Cardiac: Regular rate and rhythm, no S3 or significant systolic murmur, no pericardial rub. Abdomen: Soft, nontender, no hepatomegaly,  bowel sounds present, no guarding or rebound. Extremities: No pitting edema, distal pulses 2+. Skin: Warm and dry. Musculoskeletal: No kyphosis. Neuropsychiatric: Alert and oriented x3, affect grossly appropriate.  ECG: Normal sinus rhythm  Recent Labwork: No results found for requested labs within last 365 days.  No results found for: "CHOL", "TRIG", "HDL", "CHOLHDL", "VLDL", "LDLCALC", "LDLDIRECT"  Other Studies Reviewed Today: Echocardiogram from 06/2018 LVEF 50 to 55% G1 DD No valve abnormalities  Assessment and Plan:  # CAD s/p CABG in 2002, s/p PCI to mid SVG to OM and PCI to LCx in 2015, currently angina free - Continue aspirin  81 mg once daily - Continue rosuvastatin  20 mg nightly - Continue metoprolol  titrate 25 mg twice daily and ramipril  10 mg once daily. - SL NTG 0.4 mg as needed - ER precautions for chest pain  # HTN, controlled -Continue home medications.  # HLD, unknown values - Continue rosuvastatin  20 mg nightly.  Goal LDL is < 70.  Follow with PCP.  # Insulin-dependent diabetes mellitus type 2 - Lost around 50 pounds of weight after starting Ozempic.  He was recently started on insulin around 2 months ago.  Otherwise, on Farxiga 10 mg once daily, no infections recently.    Medication Adjustments/Labs and Tests Ordered: Current medicines are reviewed at length with the patient today.  Concerns regarding medicines are outlined above.   Tests Ordered: Orders Placed This Encounter  Procedures   EKG 12-Lead    Medication Changes: No orders of the defined types were placed in this encounter.   Disposition:  Follow up 1 year  Signed Maddox Hlavaty Priya Erika Slaby, MD, 11/09/2023 8:58 AM    [facility] [address] [place], [place], [place] [address]

## 2023-11-19 DIAGNOSIS — E782 Mixed hyperlipidemia: Secondary | ICD-10-CM | POA: Diagnosis not present

## 2023-11-19 DIAGNOSIS — E1122 Type 2 diabetes mellitus with diabetic chronic kidney disease: Secondary | ICD-10-CM | POA: Diagnosis not present

## 2023-11-19 DIAGNOSIS — I10 Essential (primary) hypertension: Secondary | ICD-10-CM | POA: Diagnosis not present

## 2023-12-20 DIAGNOSIS — E1122 Type 2 diabetes mellitus with diabetic chronic kidney disease: Secondary | ICD-10-CM | POA: Diagnosis not present

## 2023-12-20 DIAGNOSIS — E782 Mixed hyperlipidemia: Secondary | ICD-10-CM | POA: Diagnosis not present

## 2023-12-20 DIAGNOSIS — I10 Essential (primary) hypertension: Secondary | ICD-10-CM | POA: Diagnosis not present

## 2023-12-30 DIAGNOSIS — E1122 Type 2 diabetes mellitus with diabetic chronic kidney disease: Secondary | ICD-10-CM | POA: Diagnosis not present

## 2023-12-30 DIAGNOSIS — N1831 Chronic kidney disease, stage 3a: Secondary | ICD-10-CM | POA: Diagnosis not present

## 2023-12-30 DIAGNOSIS — E1165 Type 2 diabetes mellitus with hyperglycemia: Secondary | ICD-10-CM | POA: Diagnosis not present

## 2023-12-30 DIAGNOSIS — E7849 Other hyperlipidemia: Secondary | ICD-10-CM | POA: Diagnosis not present

## 2023-12-30 DIAGNOSIS — I10 Essential (primary) hypertension: Secondary | ICD-10-CM | POA: Diagnosis not present

## 2024-01-06 DIAGNOSIS — E1122 Type 2 diabetes mellitus with diabetic chronic kidney disease: Secondary | ICD-10-CM | POA: Diagnosis not present

## 2024-01-06 DIAGNOSIS — Z6830 Body mass index (BMI) 30.0-30.9, adult: Secondary | ICD-10-CM | POA: Diagnosis not present

## 2024-01-06 DIAGNOSIS — E782 Mixed hyperlipidemia: Secondary | ICD-10-CM | POA: Diagnosis not present

## 2024-01-06 DIAGNOSIS — E7849 Other hyperlipidemia: Secondary | ICD-10-CM | POA: Diagnosis not present

## 2024-01-06 DIAGNOSIS — Z0000 Encounter for general adult medical examination without abnormal findings: Secondary | ICD-10-CM | POA: Diagnosis not present

## 2024-01-06 DIAGNOSIS — N1831 Chronic kidney disease, stage 3a: Secondary | ICD-10-CM | POA: Diagnosis not present

## 2024-01-06 DIAGNOSIS — Z0001 Encounter for general adult medical examination with abnormal findings: Secondary | ICD-10-CM | POA: Diagnosis not present

## 2024-01-06 DIAGNOSIS — I10 Essential (primary) hypertension: Secondary | ICD-10-CM | POA: Diagnosis not present

## 2024-01-06 DIAGNOSIS — Z1331 Encounter for screening for depression: Secondary | ICD-10-CM | POA: Diagnosis not present

## 2024-01-06 DIAGNOSIS — Z1389 Encounter for screening for other disorder: Secondary | ICD-10-CM | POA: Diagnosis not present

## 2024-01-06 DIAGNOSIS — Z23 Encounter for immunization: Secondary | ICD-10-CM | POA: Diagnosis not present

## 2024-01-20 DIAGNOSIS — I10 Essential (primary) hypertension: Secondary | ICD-10-CM | POA: Diagnosis not present

## 2024-01-20 DIAGNOSIS — E1122 Type 2 diabetes mellitus with diabetic chronic kidney disease: Secondary | ICD-10-CM | POA: Diagnosis not present

## 2024-01-20 DIAGNOSIS — E782 Mixed hyperlipidemia: Secondary | ICD-10-CM | POA: Diagnosis not present

## 2024-02-09 DIAGNOSIS — H6123 Impacted cerumen, bilateral: Secondary | ICD-10-CM | POA: Diagnosis not present

## 2024-02-09 DIAGNOSIS — H903 Sensorineural hearing loss, bilateral: Secondary | ICD-10-CM | POA: Diagnosis not present

## 2024-02-18 DIAGNOSIS — I10 Essential (primary) hypertension: Secondary | ICD-10-CM | POA: Diagnosis not present

## 2024-02-18 DIAGNOSIS — E1122 Type 2 diabetes mellitus with diabetic chronic kidney disease: Secondary | ICD-10-CM | POA: Diagnosis not present

## 2024-02-18 DIAGNOSIS — E782 Mixed hyperlipidemia: Secondary | ICD-10-CM | POA: Diagnosis not present

## 2024-03-21 DIAGNOSIS — E782 Mixed hyperlipidemia: Secondary | ICD-10-CM | POA: Diagnosis not present

## 2024-03-21 DIAGNOSIS — E1122 Type 2 diabetes mellitus with diabetic chronic kidney disease: Secondary | ICD-10-CM | POA: Diagnosis not present

## 2024-03-21 DIAGNOSIS — I10 Essential (primary) hypertension: Secondary | ICD-10-CM | POA: Diagnosis not present

## 2024-04-21 DIAGNOSIS — E782 Mixed hyperlipidemia: Secondary | ICD-10-CM | POA: Diagnosis not present

## 2024-04-21 DIAGNOSIS — I10 Essential (primary) hypertension: Secondary | ICD-10-CM | POA: Diagnosis not present

## 2024-04-21 DIAGNOSIS — E1122 Type 2 diabetes mellitus with diabetic chronic kidney disease: Secondary | ICD-10-CM | POA: Diagnosis not present

## 2024-05-03 DIAGNOSIS — R5383 Other fatigue: Secondary | ICD-10-CM | POA: Diagnosis not present

## 2024-05-03 DIAGNOSIS — Z0001 Encounter for general adult medical examination with abnormal findings: Secondary | ICD-10-CM | POA: Diagnosis not present

## 2024-05-03 DIAGNOSIS — N1831 Chronic kidney disease, stage 3a: Secondary | ICD-10-CM | POA: Diagnosis not present

## 2024-05-03 DIAGNOSIS — E7849 Other hyperlipidemia: Secondary | ICD-10-CM | POA: Diagnosis not present

## 2024-05-03 DIAGNOSIS — E1165 Type 2 diabetes mellitus with hyperglycemia: Secondary | ICD-10-CM | POA: Diagnosis not present

## 2024-05-03 DIAGNOSIS — I10 Essential (primary) hypertension: Secondary | ICD-10-CM | POA: Diagnosis not present

## 2024-05-08 DIAGNOSIS — Z6828 Body mass index (BMI) 28.0-28.9, adult: Secondary | ICD-10-CM | POA: Diagnosis not present

## 2024-05-08 DIAGNOSIS — R609 Edema, unspecified: Secondary | ICD-10-CM | POA: Diagnosis not present

## 2024-05-08 DIAGNOSIS — I10 Essential (primary) hypertension: Secondary | ICD-10-CM | POA: Diagnosis not present

## 2024-05-08 DIAGNOSIS — E1122 Type 2 diabetes mellitus with diabetic chronic kidney disease: Secondary | ICD-10-CM | POA: Diagnosis not present

## 2024-05-08 DIAGNOSIS — N1831 Chronic kidney disease, stage 3a: Secondary | ICD-10-CM | POA: Diagnosis not present

## 2024-05-19 DIAGNOSIS — E782 Mixed hyperlipidemia: Secondary | ICD-10-CM | POA: Diagnosis not present

## 2024-05-19 DIAGNOSIS — E1122 Type 2 diabetes mellitus with diabetic chronic kidney disease: Secondary | ICD-10-CM | POA: Diagnosis not present

## 2024-05-19 DIAGNOSIS — I10 Essential (primary) hypertension: Secondary | ICD-10-CM | POA: Diagnosis not present
